# Patient Record
Sex: Male | Born: 1950 | Race: White | Hispanic: No | Marital: Married | Smoking: Former smoker
Health system: Southern US, Community
[De-identification: ages and names within clinical notes are randomized; demographics above are authoritative.]

## PROBLEM LIST (undated history)

## (undated) DIAGNOSIS — Z87442 Personal history of urinary calculi: Secondary | ICD-10-CM

## (undated) DIAGNOSIS — I10 Essential (primary) hypertension: Secondary | ICD-10-CM

## (undated) DIAGNOSIS — N210 Calculus in bladder: Secondary | ICD-10-CM

## (undated) DIAGNOSIS — A419 Sepsis, unspecified organism: Secondary | ICD-10-CM

## (undated) DIAGNOSIS — M1990 Unspecified osteoarthritis, unspecified site: Secondary | ICD-10-CM

## (undated) DIAGNOSIS — E785 Hyperlipidemia, unspecified: Secondary | ICD-10-CM

## (undated) DIAGNOSIS — C4490 Unspecified malignant neoplasm of skin, unspecified: Secondary | ICD-10-CM

## (undated) DIAGNOSIS — M109 Gout, unspecified: Secondary | ICD-10-CM

## (undated) HISTORY — PX: DERMABRASION OF FACE: SUR411

---

## 2000-11-20 ENCOUNTER — Encounter: Payer: Self-pay | Admitting: Emergency Medicine

## 2000-11-20 ENCOUNTER — Emergency Department (HOSPITAL_COMMUNITY): Admission: EM | Admit: 2000-11-20 | Discharge: 2000-11-20 | Payer: Self-pay | Admitting: Emergency Medicine

## 2008-01-24 ENCOUNTER — Ambulatory Visit: Payer: Self-pay | Admitting: Urology

## 2011-08-18 ENCOUNTER — Other Ambulatory Visit: Payer: Self-pay | Admitting: Urology

## 2011-09-11 ENCOUNTER — Encounter (HOSPITAL_COMMUNITY): Payer: Self-pay | Admitting: Pharmacy Technician

## 2011-09-16 ENCOUNTER — Ambulatory Visit (HOSPITAL_COMMUNITY)
Admission: RE | Admit: 2011-09-16 | Discharge: 2011-09-16 | Disposition: A | Discharge status: Home / Self Care | Payer: 59 | Source: Ambulatory Visit | Attending: Urology | Admitting: Urology

## 2011-09-16 ENCOUNTER — Encounter (HOSPITAL_COMMUNITY)
Admission: RE | Admit: 2011-09-16 | Discharge: 2011-09-16 | Disposition: A | Discharge status: Home / Self Care | Payer: 59 | Source: Ambulatory Visit | Attending: Urology | Admitting: Urology

## 2011-09-16 ENCOUNTER — Encounter (HOSPITAL_COMMUNITY): Payer: Self-pay

## 2011-09-16 ENCOUNTER — Other Ambulatory Visit: Payer: Self-pay

## 2011-09-16 DIAGNOSIS — I10 Essential (primary) hypertension: Secondary | ICD-10-CM | POA: Insufficient documentation

## 2011-09-16 DIAGNOSIS — Z01818 Encounter for other preprocedural examination: Secondary | ICD-10-CM | POA: Insufficient documentation

## 2011-09-16 DIAGNOSIS — N210 Calculus in bladder: Secondary | ICD-10-CM | POA: Insufficient documentation

## 2011-09-16 DIAGNOSIS — Z01812 Encounter for preprocedural laboratory examination: Secondary | ICD-10-CM | POA: Insufficient documentation

## 2011-09-16 HISTORY — DX: Unspecified malignant neoplasm of skin, unspecified: C44.90

## 2011-09-16 HISTORY — DX: Essential (primary) hypertension: I10

## 2011-09-16 HISTORY — DX: Calculus in bladder: N21.0

## 2011-09-16 HISTORY — DX: Hyperlipidemia, unspecified: E78.5

## 2011-09-16 LAB — BASIC METABOLIC PANEL
BUN: 13 mg/dL (ref 6–23)
CO2: 26 mEq/L (ref 19–32)
Calcium: 9.4 mg/dL (ref 8.4–10.5)
Chloride: 103 mEq/L (ref 96–112)
Creatinine, Ser: 0.97 mg/dL (ref 0.50–1.35)
GFR calc Af Amer: >90 mL/min (ref >90)
GFR calc non Af Amer: 87 mL/min — ABNORMAL LOW (ref >90)
Glucose, Bld: 129 mg/dL — ABNORMAL HIGH (ref 70–99)
Potassium: 3.3 mEq/L — ABNORMAL LOW (ref 3.5–5.1)
Sodium: 140 mEq/L (ref 135–145)

## 2011-09-16 LAB — CBC
HCT: 45.2 % (ref 39.0–52.0)
Hemoglobin: 15.8 g/dL (ref 13.0–17.0)
MCH: 32.2 pg (ref 26.0–34.0)
MCHC: 35 g/dL (ref 30.0–36.0)
MCV: 92.1 fL (ref 78.0–100.0)
Platelets: 194 10*3/uL (ref 150–400)
RBC: 4.91 MIL/uL (ref 4.22–5.81)
RDW: 12.7 % (ref 11.5–15.5)
WBC: 8.3 10*3/uL (ref 4.0–10.5)

## 2011-09-16 LAB — SURGICAL PCR SCREEN: Staphylococcus aureus: NEGATIVE

## 2011-09-16 NOTE — Pre-Procedure Instructions (Signed)
Your patient has screened at an elevated risk for obstructive sleep apnea using the STOP-BANG tool during a presurgical visit. A score of 4 or greater is an elevated risk.  

## 2011-09-16 NOTE — Patient Instructions (Signed)
20 Gregg Callahan  09/16/2011   Your procedure is scheduled on:  09/24/11  Report to SHORT STAY DEPT  at 6:00 AM.  Call this number if you have problems the morning of surgery: [phone]   Remember:   Do not eat food or drink liquids AFTER MIDNIGHT    Take these medicines the morning of surgery with A SIP OF WATER: NONE   Do not wear jewelry, make-up or nail polish.  Do not wear lotions, powders, or perfumes.   Do not shave legs or underarms 48 hrs. before surgery (men may shave face)  Do not bring valuables to the hospital.  Contacts, dentures or bridgework may not be worn into surgery.  Leave suitcase in the car. After surgery it may be brought to your room.  For patients admitted to the hospital, checkout time is 11:00 AM the day of discharge.   Patients discharged the day of surgery will not be allowed to drive home.    Special Instructions:   Please read over the following fact sheets that you were given: MRSA  Information               SHOWER WITH BETASEPT THE NIGHT BEFORE SURGERY AND THE MORNING OF SURGERY

## 2011-09-16 NOTE — Progress Notes (Signed)
09/16/11 1611  OBSTRUCTIVE SLEEP APNEA  Have you ever been diagnosed with sleep apnea through a sleep study? No  Do you snore loudly (loud enough to be heard through closed doors)?  0  Do you often feel tired, fatigued, or sleepy during the daytime? 0  Has anyone observed you stop breathing during your sleep? 0  Do you have, or are you being treated for high blood pressure? 1  BMI more than 35 kg/m2? 0  Age over 62 years old? 1  Neck circumference greater than 40 cm/18 inches? 1  Gender: 1  Obstructive Sleep Apnea Score 4   Score 4 or greater  Updated health history;Results sent to PCP

## 2011-09-24 ENCOUNTER — Encounter (HOSPITAL_COMMUNITY): Payer: Self-pay | Admitting: Anesthesiology

## 2011-09-24 ENCOUNTER — Encounter (HOSPITAL_COMMUNITY): Payer: Self-pay | Admitting: *Deleted

## 2011-09-24 ENCOUNTER — Ambulatory Visit (HOSPITAL_COMMUNITY)
Admission: RE | Admit: 2011-09-24 | Discharge: 2011-09-25 | Disposition: A | Discharge status: Home / Self Care | Payer: 59 | Source: Ambulatory Visit | Attending: Urology | Admitting: Urology

## 2011-09-24 ENCOUNTER — Ambulatory Visit (HOSPITAL_COMMUNITY): Payer: 59 | Admitting: Anesthesiology

## 2011-09-24 ENCOUNTER — Encounter (HOSPITAL_COMMUNITY)
Admission: RE | Disposition: A | Discharge status: Home / Self Care | Payer: Self-pay | Source: Ambulatory Visit | Attending: Urology

## 2011-09-24 DIAGNOSIS — I10 Essential (primary) hypertension: Secondary | ICD-10-CM | POA: Insufficient documentation

## 2011-09-24 DIAGNOSIS — E785 Hyperlipidemia, unspecified: Secondary | ICD-10-CM | POA: Insufficient documentation

## 2011-09-24 DIAGNOSIS — Z7982 Long term (current) use of aspirin: Secondary | ICD-10-CM | POA: Insufficient documentation

## 2011-09-24 DIAGNOSIS — N210 Calculus in bladder: Secondary | ICD-10-CM | POA: Insufficient documentation

## 2011-09-24 DIAGNOSIS — N400 Enlarged prostate without lower urinary tract symptoms: Secondary | ICD-10-CM

## 2011-09-24 DIAGNOSIS — Z79899 Other long term (current) drug therapy: Secondary | ICD-10-CM | POA: Insufficient documentation

## 2011-09-24 DIAGNOSIS — N401 Enlarged prostate with lower urinary tract symptoms: Secondary | ICD-10-CM | POA: Insufficient documentation

## 2011-09-24 DIAGNOSIS — G4730 Sleep apnea, unspecified: Secondary | ICD-10-CM | POA: Insufficient documentation

## 2011-09-24 DIAGNOSIS — N138 Other obstructive and reflux uropathy: Secondary | ICD-10-CM | POA: Insufficient documentation

## 2011-09-24 HISTORY — PX: TRANSURETHRAL RESECTION OF PROSTATE: SHX73

## 2011-09-24 HISTORY — PX: CYSTOSCOPY: SHX5120

## 2011-09-24 LAB — BASIC METABOLIC PANEL
BUN: 14 mg/dL (ref 6–23)
Calcium: 8.4 mg/dL (ref 8.4–10.5)
GFR calc non Af Amer: 77 mL/min — ABNORMAL LOW (ref >90)
Glucose, Bld: 142 mg/dL — ABNORMAL HIGH (ref 70–99)

## 2011-09-24 SURGERY — CYSTOSCOPY
Anesthesia: General | Wound class: Clean Contaminated

## 2011-09-24 MED ORDER — CEFAZOLIN SODIUM-DEXTROSE 2-3 GM-% IV SOLR
INTRAVENOUS | Status: AC
  Filled 2011-09-24: qty 50

## 2011-09-24 MED ORDER — MORPHINE SULFATE 2 MG/ML IJ SOLN: 2.0000 mg | INTRAMUSCULAR | Status: DC | PRN

## 2011-09-24 MED ORDER — SUCCINYLCHOLINE CHLORIDE 20 MG/ML IJ SOLN
INTRAMUSCULAR | Status: DC | PRN
  Administered 2011-09-24: 100 mg via INTRAVENOUS

## 2011-09-24 MED ORDER — LACTATED RINGERS IV SOLN
INTRAVENOUS | Status: DC | PRN
  Administered 2011-09-24 (×2): via INTRAVENOUS

## 2011-09-24 MED ORDER — BELLADONNA ALKALOIDS-OPIUM 16.2-60 MG RE SUPP
RECTAL | Status: AC
  Filled 2011-09-24: qty 1

## 2011-09-24 MED ORDER — HYDROCHLOROTHIAZIDE 25 MG PO TABS
25.0000 mg | ORAL_TABLET | Freq: Every day | ORAL | Status: DC
  Administered 2011-09-24: 25 mg via ORAL
  Filled 2011-09-24 (×3): qty 1

## 2011-09-24 MED ORDER — HYDROMORPHONE HCL PF 1 MG/ML IJ SOLN: 0.2500 mg | INTRAMUSCULAR | Status: DC | PRN

## 2011-09-24 MED ORDER — LIDOCAINE HCL (CARDIAC) 20 MG/ML IV SOLN
INTRAVENOUS | Status: DC | PRN
  Administered 2011-09-24: 100 mg via INTRAVENOUS

## 2011-09-24 MED ORDER — BELLADONNA ALKALOIDS-OPIUM 16.2-60 MG RE SUPP: 1.0000 | Freq: Four times a day (QID) | RECTAL | Status: DC | PRN

## 2011-09-24 MED ORDER — PROPOFOL 10 MG/ML IV EMUL
INTRAVENOUS | Status: DC | PRN
  Administered 2011-09-24: 200 mg via INTRAVENOUS

## 2011-09-24 MED ORDER — DEXAMETHASONE SODIUM PHOSPHATE 4 MG/ML IJ SOLN
INTRAMUSCULAR | Status: DC | PRN
  Administered 2011-09-24: 10 mg via INTRAVENOUS

## 2011-09-24 MED ORDER — 0.9 % SODIUM CHLORIDE (POUR BTL) OPTIME
TOPICAL | Status: DC | PRN
  Administered 2011-09-24: 1000 mL

## 2011-09-24 MED ORDER — ONDANSETRON HCL 4 MG/2ML IJ SOLN
INTRAMUSCULAR | Status: DC | PRN
  Administered 2011-09-24: 4 mg via INTRAVENOUS

## 2011-09-24 MED ORDER — ACETAMINOPHEN 10 MG/ML IV SOLN
INTRAVENOUS | Status: DC | PRN
  Administered 2011-09-24: 1000 mg via INTRAVENOUS

## 2011-09-24 MED ORDER — LIDOCAINE HCL 2 % EX GEL
CUTANEOUS | Status: AC
  Filled 2011-09-24: qty 10

## 2011-09-24 MED ORDER — CEFAZOLIN SODIUM-DEXTROSE 2-3 GM-% IV SOLR
2.0000 g | Freq: Three times a day (TID) | INTRAVENOUS | Status: AC
  Administered 2011-09-24 – 2011-09-25 (×2): 2 g via INTRAVENOUS
  Filled 2011-09-24 (×2): qty 50

## 2011-09-24 MED ORDER — MIDAZOLAM HCL 5 MG/5ML IJ SOLN
INTRAMUSCULAR | Status: DC | PRN
  Administered 2011-09-24: 2 mg via INTRAVENOUS

## 2011-09-24 MED ORDER — OXYCODONE HCL 5 MG PO TABS
5.0000 mg | ORAL_TABLET | ORAL | Status: DC | PRN
  Administered 2011-09-24 – 2011-09-25 (×4): 5 mg via ORAL
  Filled 2011-09-24: qty 2
  Filled 2011-09-24 (×3): qty 1

## 2011-09-24 MED ORDER — STERILE WATER FOR IRRIGATION IR SOLN
Status: DC | PRN
  Administered 2011-09-24: 500 mL

## 2011-09-24 MED ORDER — FENTANYL CITRATE 0.05 MG/ML IJ SOLN
INTRAMUSCULAR | Status: DC | PRN
  Administered 2011-09-24 (×3): 50 ug via INTRAVENOUS
  Administered 2011-09-24: 100 ug via INTRAVENOUS

## 2011-09-24 MED ORDER — SODIUM CHLORIDE 0.9 % IV SOLN
INTRAVENOUS | Status: DC
  Administered 2011-09-24 – 2011-09-25 (×2): via INTRAVENOUS

## 2011-09-24 MED ORDER — SODIUM CHLORIDE 0.9 % IR SOLN
Status: DC | PRN
  Administered 2011-09-24: 5400 mL

## 2011-09-24 MED ORDER — ACETAMINOPHEN 10 MG/ML IV SOLN
INTRAVENOUS | Status: AC
  Filled 2011-09-24: qty 100

## 2011-09-24 MED ORDER — CEFAZOLIN SODIUM 1-5 GM-% IV SOLN
1.0000 g | INTRAVENOUS | Status: AC
  Administered 2011-09-24: 2 g via INTRAVENOUS

## 2011-09-24 MED ORDER — BACITRACIN-NEOMYCIN-POLYMYXIN 400-5-5000 EX OINT
1.0000 "application " | TOPICAL_OINTMENT | Freq: Three times a day (TID) | CUTANEOUS | Status: DC | PRN
  Administered 2011-09-24: 1 via TOPICAL

## 2011-09-24 MED ORDER — HYDROCHLOROTHIAZIDE 25 MG PO TABS: 25.0000 mg | ORAL_TABLET | Freq: Every day | ORAL | Status: DC

## 2011-09-24 MED ORDER — SENNOSIDES-DOCUSATE SODIUM 8.6-50 MG PO TABS
1.0000 | ORAL_TABLET | Freq: Two times a day (BID) | ORAL | Status: DC
  Administered 2011-09-24 (×2): 1 via ORAL
  Filled 2011-09-24 (×4): qty 1

## 2011-09-24 MED ORDER — LACTATED RINGERS IV SOLN: INTRAVENOUS | Status: DC

## 2011-09-24 MED ORDER — LISINOPRIL 40 MG PO TABS: 40.0000 mg | ORAL_TABLET | Freq: Every morning | ORAL | Status: DC

## 2011-09-24 MED ORDER — HYDROMORPHONE HCL PF 1 MG/ML IJ SOLN
INTRAMUSCULAR | Status: DC | PRN
  Administered 2011-09-24 (×4): 0.5 mg via INTRAVENOUS

## 2011-09-24 MED ORDER — LISINOPRIL 40 MG PO TABS
40.0000 mg | ORAL_TABLET | Freq: Every morning | ORAL | Status: DC
  Administered 2011-09-24: 40 mg via ORAL
  Filled 2011-09-24 (×3): qty 1

## 2011-09-24 MED ORDER — ACETAMINOPHEN 10 MG/ML IV SOLN
1000.0000 mg | Freq: Four times a day (QID) | INTRAVENOUS | Status: DC
  Administered 2011-09-24 – 2011-09-25 (×3): 1000 mg via INTRAVENOUS
  Filled 2011-09-24 (×4): qty 100

## 2011-09-24 MED ORDER — ONDANSETRON HCL 4 MG/2ML IJ SOLN: 4.0000 mg | INTRAMUSCULAR | Status: DC | PRN

## 2011-09-24 MED ORDER — ACETAMINOPHEN 325 MG PO TABS: 650.0000 mg | ORAL_TABLET | ORAL | Status: DC | PRN

## 2011-09-24 MED ORDER — SIMVASTATIN 40 MG PO TABS
40.0000 mg | ORAL_TABLET | Freq: Every day | ORAL | Status: DC
  Administered 2011-09-24: 40 mg via ORAL
  Filled 2011-09-24 (×2): qty 1

## 2011-09-24 MED ORDER — BELLADONNA ALKALOIDS-OPIUM 16.2-60 MG RE SUPP
RECTAL | Status: DC | PRN
  Administered 2011-09-24: 1 via RECTAL

## 2011-09-24 MED ORDER — IOHEXOL 300 MG/ML  SOLN
INTRAMUSCULAR | Status: AC
  Filled 2011-09-24: qty 1

## 2011-09-24 SURGICAL SUPPLY — 36 items
BAG URINE DRAINAGE (UROLOGICAL SUPPLIES) IMPLANT
BAG URO CATCHER STRL LF (DRAPE) ×2 IMPLANT
BLADE SURG 15 STRL LF DISP TIS (BLADE) IMPLANT
BLADE SURG 15 STRL SS (BLADE)
CATCHER STONE W/TUBE ADAPTER (UROLOGICAL SUPPLIES) IMPLANT
CATH COUDE 24FR 5CC (CATHETERS) IMPLANT
CATH HEMA 3WAY 30CC 24FR COUDE (CATHETERS) ×1 IMPLANT
CATH RIBBED COUDE  30CC (CATHETERS)
CATH RIBBED COUDE 30CC (CATHETERS)
CLOTH BEACON ORANGE TIMEOUT ST (SAFETY) ×2 IMPLANT
DRAPE CAMERA CLOSED 9X96 (DRAPES) ×2 IMPLANT
ELECT LOOP MED HF 24F 12D (CUTTING LOOP) ×2 IMPLANT
ELECT REM PT RETURN 9FT ADLT (ELECTROSURGICAL) ×2
ELECT RESECT VAPORIZE 12D CBL (ELECTRODE) ×2 IMPLANT
ELECTRODE REM PT RTRN 9FT ADLT (ELECTROSURGICAL) ×1 IMPLANT
GLOVE BIOGEL M 7.0 STRL (GLOVE) ×4 IMPLANT
GLOVE BIOGEL PI IND STRL 7.5 (GLOVE) ×1 IMPLANT
GLOVE BIOGEL PI INDICATOR 7.5 (GLOVE) ×1
GLOVE ECLIPSE 7.5 STRL STRAW (GLOVE) ×2 IMPLANT
GOWN PREVENTION PLUS XLARGE (GOWN DISPOSABLE) ×2 IMPLANT
GOWN STRL NON-REIN LRG LVL3 (GOWN DISPOSABLE) ×2 IMPLANT
GUIDEWIRE STR DUAL SENSOR (WIRE) ×2 IMPLANT
HOLDER FOLEY CATH W/STRAP (MISCELLANEOUS) IMPLANT
KIT ASPIRATION TUBING (SET/KITS/TRAYS/PACK) ×2 IMPLANT
KIT SUPRAPUBIC CATH (MISCELLANEOUS) IMPLANT
MANIFOLD NEPTUNE II (INSTRUMENTS) ×2 IMPLANT
NDL SPNL 18GX3.5 QUINCKE PK (NEEDLE) IMPLANT
NEEDLE SPNL 18GX3.5 QUINCKE PK (NEEDLE) IMPLANT
NS IRRIG 1000ML POUR BTL (IV SOLUTION) ×2 IMPLANT
PACK CYSTO (CUSTOM PROCEDURE TRAY) ×2 IMPLANT
PROBE LITHOCLAST ULTRA 3.8X403 (UROLOGICAL SUPPLIES) ×1 IMPLANT
STONE CATCHER W/TUBE ADAPTER (UROLOGICAL SUPPLIES) ×2 IMPLANT
SUT ETHILON 3 0 PS 1 (SUTURE) IMPLANT
SYR 30ML LL (SYRINGE) IMPLANT
SYRINGE IRR TOOMEY STRL 70CC (SYRINGE) ×2 IMPLANT
TUBING CONNECTING 10 (TUBING) ×2 IMPLANT

## 2011-09-24 NOTE — Anesthesia Postprocedure Evaluation (Signed)
  Anesthesia Post-op Note  Patient: Gregg Callahan  Procedure(s) Performed: Procedure(s) (LRB): CYSTOSCOPY (N/A) TRANSURETHRAL RESECTION OF THE PROSTATE WITH GYRUS INSTRUMENTS (N/A)  Patient Location: PACU  Anesthesia Type: General  Level of Consciousness: oriented and sedated  Airway and Oxygen Therapy: Patient Spontanous Breathing  Post-op Pain: mild  Post-op Assessment: Post-op Vital signs reviewed, Patient's Cardiovascular Status Stable, Respiratory Function Stable and Patent Airway  Post-op Vital Signs: stable  Complications: No apparent anesthesia complications

## 2011-09-24 NOTE — Brief Op Note (Signed)
09/24/2011  11:20 AM  PATIENT:  Gregg Callahan  61 y.o. male  PRE-OPERATIVE DIAGNOSIS:  BLADDER STONE, BPH  POST-OPERATIVE DIAGNOSIS:  Same  PROCEDURE:  Procedure(s) (LRB): CYSTOSCOPY (N/A) Urethral meatal dilation Cystolithalopaxy TRANSURETHRAL RESECTION OF THE PROSTATE WITH GYRUS INSTRUMENTS (N/A)  SURGEON:  Surgeon(s) and Role:    * Milford Cage, MD - Primary  PHYSICIAN ASSISTANT:   ASSISTANTS: none   ANESTHESIA:   general  EBL: 150cc  Total I/O In: 1000 [I.V.:1000] Out: -   BLOOD ADMINISTERED:none  DRAINS: Urinary Catheter (Foley)   LOCAL MEDICATIONS USED:  OTHER B&O suppository.   SPECIMEN:  Source of Specimen:  prostate, bladder stone.  DISPOSITION OF SPECIMEN:  stone given to family.  COUNTS:  YES  TOURNIQUET:  * No tourniquets in log *  DICTATION: .Other Dictation: Dictation Number [id number]  PLAN OF CARE: Admit for overnight observation  PATIENT DISPOSITION:  PACU - hemodynamically stable.   Delay start of Pharmacological VTE agent (>24hrs) due to surgical blood loss or risk of bleeding: yes

## 2011-09-24 NOTE — Anesthesia Preprocedure Evaluation (Signed)
Anesthesia Evaluation  Patient identified by MRN, date of birth, ID band Patient awake    Reviewed: Allergy & Precautions, H&P , NPO status , Patient's Chart, lab work & pertinent test results, reviewed documented beta blocker date and time   Airway Mallampati: II  Neck ROM: Full    Dental  (+) Dental Advisory Given and Teeth Intact   Pulmonary neg pulmonary ROS,  breath sounds clear to auscultation        Cardiovascular hypertension, Pt. on medications Rhythm:Regular Rate:Normal  Denies cardiac symptoms   Neuro/Psych negative neurological ROS  negative psych ROS   GI/Hepatic negative GI ROS, Neg liver ROS,   Endo/Other  negative endocrine ROS  Renal/GU negative Renal ROS   Bladder stone BPH    Musculoskeletal negative musculoskeletal ROS (+)   Abdominal   Peds negative pediatric ROS (+)  Hematology negative hematology ROS (+)   Anesthesia Other Findings Caps everywhere  Reproductive/Obstetrics negative OB ROS                           Anesthesia Physical Anesthesia Plan  ASA: II  Anesthesia Plan: General   Post-op Pain Management:    Induction: Intravenous  Airway Management Planned: Oral ETT  Additional Equipment:   Intra-op Plan:   Post-operative Plan: Extubation in OR  Informed Consent: I have reviewed the patients History and Physical, chart, labs and discussed the procedure including the risks, benefits and alternatives for the proposed anesthesia with the patient or authorized representative who has indicated his/her understanding and acceptance.   Dental advisory given  Plan Discussed with: CRNA and Surgeon  Anesthesia Plan Comments:         Anesthesia Quick Evaluation

## 2011-09-24 NOTE — Discharge Instructions (Signed)
DISCHARGE INSTRUCTIONS FOR TURP ° °MEDICATIONS: ° °1. DO NOT RESUME YOUR ASPIRIN, WARFARIN, OR OTHER BLOOD THINNER FOR 1 WEEK. ° °2. Resume all your other meds from home, including finasteride and tamsulosin if you were taking them before. ° °ACTIVITY °1. No heavy lifting >10 pounds for 2 weeks °2. No sexual activity for 2 weeks °3. No strenuous activity for 2 weeks °4. No driving while on narcotic pain medications °5. Drink plenty of water °6. Continue to walk at home - you can still get blood clots when you are at  °home, so keep active, but don't over do it. °7. Your urine may have some blood in it - make sure you drink plenty of water,  °call or come to the ER immediately if you can't urinate at all °8. No straddle activity (such as riding a bike or a horse)for 3 weeks. ° °BATHING °1. You can shower. Do not take baths or submerge the catheter underwater. ° °CATHETER (If you are discharged with a catheter.) °1. Keep your catheter secured to your leg at all times with tape. °2. You may experience leakage of urine around your catheter- as long as the  °catheter continues to drain, this is normal.  If your catheter stops draining  °return to the ER, but do not let anyone other than a urologist replace your  °catheter. °3. You may also have blood in your urine, even after it has been clear for  °several days; you may even pass some small blood clots or other material.  This  °is normal as well.  If this happens, sit down and drink plenty of water to help  °make urine to flush out your bladder.  If the blood in your urine becomes worse  °after doing this, contact our office or return to the ER. °4. You may use the leg bag (small bag) during the day, but use the large bag at  °night. ° ° ° °SIGNS/SYMPTOMS TO CALL: °1. Please call us if you have a fever greater than 101.5, uncontrolled  °nausea/vomiting, uncontrolled pain, dizziness, unable to urinate,  °chest pain, shortness of breath, leg swelling, leg pain, or any  other concerns  °or questions. ° °You can reach us at [phone]. ° °  °

## 2011-09-24 NOTE — Transfer of Care (Signed)
Immediate Anesthesia Transfer of Care Note  Patient: Gregg Callahan  Procedure(s) Performed: Procedure(s) (LRB): CYSTOSCOPY (N/A) TRANSURETHRAL RESECTION OF THE PROSTATE WITH GYRUS INSTRUMENTS (N/A)  Patient Location: PACU  Anesthesia Type: General  Level of Consciousness: awake, sedated and patient cooperative  Airway & Oxygen Therapy: Patient Spontanous Breathing and Patient connected to face mask oxygen  Post-op Assessment: Report given to PACU RN and Post -op Vital signs reviewed and stable  Post vital signs: Reviewed and stable  Complications: No apparent anesthesia complications

## 2011-09-24 NOTE — H&P (Signed)
Urology History and Physical Exam  CC: Bladder stone, BPH  HPI:       61 year old male with dysuria, frequency, and urgency. He has a history of BPH. He was found to have a bladder calculus.  Due to his enlarged prostate causing urinary symptoms and his bladder stone he has chosen to have transurethral resection of the prostate and cystolithalopaxy.  We have discussed the risks, benefits, alternatives, and likelihood of achieving his goals.  UA from clinic 08/15/11 was negative for signs of infection.   PMH: Past Medical History  Diagnosis Date  . Hypertension   . Hyperlipemia   . Bladder stone   . Skin cancer   . Sleep apnea     STOP BANG SCORE 4    PSH: Past Surgical History  Procedure Date  . Dermabrasion of face     Allergies: Allergies  Allergen Reactions  . Benadryl (Diphenhydramine Hcl) Swelling    Medications: Prescriptions prior to admission  Medication Sig Dispense Refill  . hydrochlorothiazide (HYDRODIURIL) 25 MG tablet Take 25 mg by mouth daily with breakfast.      . lisinopril (PRINIVIL,ZESTRIL) 40 MG tablet Take 40 mg by mouth daily with breakfast.      . pravastatin (PRAVACHOL) 80 MG tablet Take 80 mg by mouth every evening.      Marland Kitchen aspirin 81 MG chewable tablet Chew 81 mg by mouth daily with breakfast.         Social History: History   Social History  . Marital Status: Married    Spouse Name: N/A    Number of Children: N/A  . Years of Education: N/A   Occupational History  . Not on file.   Social History Main Topics  . Smoking status: Former Smoker    Quit date: 09/15/2001  . Smokeless tobacco: Not on file  . Alcohol Use: Yes     4 oz daily  . Drug Use:   . Sexually Active:    Other Topics Concern  . Not on file   Social History Narrative  . No narrative on file    Family History: History reviewed. No pertinent family history.  Review of Systems: Positive: None Negative: Hematuria, fever, chest pain.  A further 10 point review of  systems was negative except what is listed in the HPI.  Physical Exam:  General: No acute distress.  Awake. Head:  Normocephalic.  Atraumatic. ENT:  EOMI.  Mucous membranes moist Neck:  Supple.  No lymphadenopathy. CV:  S1 present. S2 present. Regular rate. Pulmonary: Equal effort bilaterally.  Clear to auscultation bilaterally. Abdomen: Soft.  Non- tender to palpation. Skin:  Normal turgor.  No visible rash. Extremity: No gross deformity of bilateral upper extremities.  No gross deformity of    bilateral lower extremities. Neurologic: Alert. Appropriate mood.  Studies:  No results found for this basename: HGB:2,WBC:2,PLT:2 in the last 72 hours  No results found for this basename: NA:2,K:2,CL:2,CO2:2,BUN:2,CREATININE:2,CALCIUM:2,MAGNESIUM:2,GFRNONAA:2,GFRAA:2 in the last 72 hours   No results found for this basename: PT:2,INR:2,APTT:2 in the last 72 hours   No components found with this basename: ABG:2    Assessment:  Bladder stone and BPH.  Plan: To OR for TURP and cystolithalopaxy.

## 2011-09-24 NOTE — Progress Notes (Signed)
GU post-op note  Patient doing well. Has some stinging/burning at the tip of penis. No abdominal pain. I explained the surgery and findings.  Filed Vitals:   09/24/11 1404  BP: 150/86  Pulse: 67  Temp: 97.8 F (36.6 C)  Resp: 18   Gen: NAD Abd: soft, NTTP CV: RRR Chest: equal effort bilaterally  A/P: Cystolithalopaxy & TURP today. -Continue CBI -Plan for likely discharge home in the morning.

## 2011-09-24 NOTE — Preoperative (Signed)
Beta Blockers   Reason not to administer Beta Blockers:Not Applicable 

## 2011-09-25 MED ORDER — BACITRACIN-NEOMYCIN-POLYMYXIN 400-5-5000 EX OINT: 1.0000 "application " | TOPICAL_OINTMENT | Freq: Three times a day (TID) | CUTANEOUS | Status: AC | PRN

## 2011-09-25 MED ORDER — HYOSCYAMINE SULFATE 0.125 MG PO TABS: 0.1250 mg | ORAL_TABLET | ORAL | Status: DC | PRN

## 2011-09-25 MED ORDER — OXYCODONE HCL 5 MG PO TABS: 5.0000 mg | ORAL_TABLET | ORAL | Status: DC | PRN

## 2011-09-25 MED ORDER — SENNOSIDES-DOCUSATE SODIUM 8.6-50 MG PO TABS: 1.0000 | ORAL_TABLET | Freq: Two times a day (BID) | ORAL | Status: DC

## 2011-09-25 MED ORDER — BACITRACIN-NEOMYCIN-POLYMYXIN 400-5-5000 EX OINT: 1.0000 "application " | TOPICAL_OINTMENT | Freq: Three times a day (TID) | CUTANEOUS | Status: DC | PRN

## 2011-09-25 MED ORDER — SENNOSIDES-DOCUSATE SODIUM 8.6-50 MG PO TABS: 1.0000 | ORAL_TABLET | Freq: Two times a day (BID) | ORAL | Status: AC

## 2011-09-25 MED ORDER — OXYCODONE HCL 5 MG PO TABS: 5.0000 mg | ORAL_TABLET | ORAL | Status: AC | PRN

## 2011-09-25 NOTE — Discharge Summary (Signed)
Physician Discharge Summary  Patient ID: Gregg Callahan MRN: [id number] DOB/AGE: 62/06/1950 62 y.o.  Admit date: 09/24/2011 Discharge date: 09/25/2011  Admission Diagnoses:     BPH, bladder stone  Discharge Diagnoses:  BPH Bladder stone  Discharged Condition: good  Hospital Course:  Admitted after TURP and cystolithalopaxy for continuous bladder irrigation (CBI). This was run overnight and able to be turned off early on POD#1. He was able to control his pain with oral medication and tolerated a regular diet. He was discharged home with the catheter in place.  Consults: None  Significant Diagnostic Studies: None  Treatments: surgery: Transurethral resection of prostate, cystolithalopaxy.  Discharge Exam: Blood pressure 122/66, pulse 58, temperature 97.6 F (36.4 C), temperature source Oral, resp. rate 18, height 6' (1.829 m), weight 109.907 kg (242 lb 4.8 oz), SpO2 97.00%. See PE from progress note on day of discharge.  Disposition: Home- self care.  Discharge Orders    Future Orders Please Complete By Expires   Discharge patient        Medication List  As of 09/25/2011  7:19 AM   STOP taking these medications         aspirin 81 MG chewable tablet         TAKE these medications         hydrochlorothiazide 25 MG tablet   Commonly known as: HYDRODIURIL   Take 25 mg by mouth daily with breakfast.      hyoscyamine 0.125 MG tablet   Commonly known as: LEVSIN, ANASPAZ   Take 1 tablet (0.125 mg total) by mouth every 4 (four) hours as needed for cramping (bladder spasms).      lisinopril 40 MG tablet   Commonly known as: PRINIVIL,ZESTRIL   Take 40 mg by mouth daily with breakfast.      neomycin-bacitracin-polymyxin ointment   Commonly known as: NEOSPORIN   Apply 1 application topically 3 (three) times daily as needed (catheter irritation). apply to eye      oxyCODONE 5 MG immediate release tablet   Commonly known as: Oxy IR/ROXICODONE   Take 1-2 tablets (5-10 mg  total) by mouth every 4 (four) hours as needed.      pravastatin 80 MG tablet   Commonly known as: PRAVACHOL   Take 80 mg by mouth every evening.      senna-docusate 8.6-50 MG per tablet   Commonly known as: Senokot-S   Take 1 tablet by mouth 2 (two) times daily.           Follow-up Information    Follow up with Saunders Revel, PA on 09/29/2011. (10:00 am)    Contact information:   [address] [facility] [address] [facility] [place] [address] [phone]          Signed: Milford Cage 09/25/2011, 7:19 AM

## 2011-09-25 NOTE — Progress Notes (Signed)
Urology Progress Note  Subjective:     No acute urologic events overnight. Pain controlled. Urine remains light to dark pink off CBI for 2 hours.  ROS: Negative: chest pain  Objective:  Patient Vitals for the past 24 hrs:  BP Temp Temp src Pulse Resp SpO2 Height Weight  09/25/11 0608 122/66 mmHg 97.6 F (36.4 C) Oral 58  18  97 % - -  09/25/11 0154 122/62 mmHg 97.7 F (36.5 C) Oral 63  18  97 % - -  09/24/11 2150 113/67 mmHg 98.3 F (36.8 C) Oral 81  18  96 % - -  09/24/11 1404 150/86 mmHg 97.8 F (36.6 C) Oral 67  18  96 % - -  09/24/11 1242 - - Oral - - - 6' (1.829 m) 109.907 kg (242 lb 4.8 oz)  09/24/11 1233 164/99 mmHg 97.5 F (36.4 C) - 73  18  95 % - -  09/24/11 1200 139/76 mmHg 97.1 F (36.2 C) - 75  14  100 % - -  09/24/11 1145 130/64 mmHg - - 80  19  100 % - -  09/24/11 1130 129/74 mmHg 97.6 F (36.4 C) - 76  14  98 % - -    Physical Exam: General:  No acute distress, awake Cardiovascular:    [x]   S1/S2 present, RRR  []   Irregularly irregular Chest:  CTA-B Abdomen:               []  Soft, appropriately TTP  [x]  Soft, NTTP  []  Soft, appropriately TTP, incision(s) clean/dry/intact  Genitourinary: Catheter in place. No scrotal edema. Foley:  Draining light pink to dark pink fluid without CBI running.    I/O last 3 completed shifts: In: 16109 [P.O.:240; I.V.:3940; Other:7000; IV Piggyback:400] Out: 60454 [Urine:16475]  Recent Labs  [facility] 09/24/11 1154   HGB 14.8   WBC --   PLT --    Recent Labs  Basename 09/24/11 1154   NA 138   K 4.3   CL 105   CO2 26   BUN 14   CREATININE 1.02   CALCIUM 8.4   GFRNONAA 77*   GFRAA 89*     No results found for this basename: PT:2,INR:2,APTT:2 in the last 72 hours   No components found with this basename: ABG:2    Length of stay: 1 days.  Assessment: BPH/bladder stone. POD#1 TURP & cystolithalopaxy  Plan: -Patient may ambulate. -Discharge home.   Natalia Leatherwood, MD [phone]

## 2011-09-25 NOTE — Op Note (Signed)
NAME:  Gregg Callahan, Gregg Callahan NO.:  [id number]  MEDICAL RECORD NO.:  [id number]  LOCATION:  1435                         FACILITY:  [facility]  PHYSICIAN:  Natalia Leatherwood, MD    DATE OF BIRTH:  [birth year]  DATE OF PROCEDURE:  09/24/2011 DATE OF DISCHARGE:                                OPERATIVE REPORT   SURGEON:  Natalia Leatherwood, MD.  ASSISTANT:  None.  PREOPERATIVE DIAGNOSES:  Bladder stone and benign prostatic hypertrophy.  POSTOPERATIVE DIAGNOSES:  Bladder stone and benign prostatic hypertrophy.  PROCEDURES PERFORMED: 1. Transurethral resection of prostate. 2. Cystolitholapaxy. 3. Urethral meatal dilation. 4. Cystoscopy.  COMPLICATIONS:  None.  ESTIMATED BLOOD LOSS:  Approximately 150 cc.  DRAINS:  24-French 3-way Foley catheter with 30 cc of sterile water in the balloon.  FINDINGS:  Large obstructing prostate and bladder stone.  SPECIMEN:  Bladder chips were sent for permanent pathology.  Bladder stone was given to the family.  HISTORY OF PRESENT ILLNESS:  This is a 61 year old gentleman who presented with a bladder stone to the clinic.  After counseling regarding options of removal of the stone, I also explained to the patient that a bladder stone is a sign of urinary obstruction usually due to prostatic enlargement.  After discussion of the risks, benefits, complications, side effects, and alternatives, he elected to proceed with TURP as well.  He presents for that procedure today.  PROCEDURE IN DETAIL:  Informed was obtained.  The patient was taken to the operating room, was placed in supine position.  IV antibiotics were infused and general anesthesia was induced.  The patient was then placed in a dorsal lithotomy position making sure to pad all pertinent neurovascular pressure points appropriately.  SCDs were in place and turned on prior to induction.  His genitals were then prepped and draped in the usual sterile fashion.  Time-out was performed  in which the correct patient, surgical site, and procedure were identified and agreed upon by the team.  Before I was able to place the cystoscope through the urethra it was noted that the cystoscope could not fit and therefore a meatal urethral dilation was performed with Hegar dilators beginning with 16-French and dilating up to 28-French.  After this was done, the cystoscope was passed easily and the location of the ureteral orifices were identified and the stone was identified.  Next, the rigid nephroscope was advanced through the urethra with the visual obturator, and the stone was identified, and lithotripsy was carried out with ultrasonic power with the lithoclast.  After the stone fragments were broken up small enough to pass the sheath, the fragments were removed and set to the side.  These fragments were later given to the patient's family.  Next, the visual obturator to the gyrus resectoscope was passed through the urethra and into the bladder.  Both ureteral orifices were identified.  Next, dissection was carried out at the 6 o'clock position down to the bladder neck and carried distally to just proximal to the verumontanum.  After this was done, resection was carried out with the loop and the plasma button combination to resect down to create a channel in the prostate.  This was  carried around laterally on the left side first.  As the tissue was removed, it was noted that the anterior tissue and right lateral tissue did fall into the space where tissue had been removed during resection.  As tissue was removed, hemostasis was maintained.  All dissection was maintained proximal to the external urethral sphincter.  After this was done, resection was carried out on the right side in a similar fashion of the prostate. Some of the anterior prostatic tissue did have to be removed as it became obstructing after the lateral portions of the prostate were removed.  There was slight  undermining of the bladder neck noted and the circular fibers of the bladder were able to be seen.  After this was completed, all the chips were removed and these were sent to Pathology for permanent section.  Hemostasis was maintained.  All prostate chips were removed from the bladder. After this was completed, I pulled back to the verumontanum where there was a good open channel.  Next, a 24-French 3-way hematuria coude-tip Foley catheter was placed with ease and 30 cc of sterile water placed into the bladder. This was placed on mild traction and irrigated.  There were no chips left in the bladder and this was irrigated to a light pink.  Continuous bladder irrigation was set up, and turned on.  A belladonna and opioid suppository was then placed in the patient's rectum.  This completed the procedure.  He was placed back in supine position.  Anesthesia was reversed.  He was taken to PACU in stable condition.  He will be kept overnight for continuous bladder irrigation and observation.          ______________________________ Natalia Leatherwood, MD     DW/MEDQ  D:  09/24/2011  T:  09/25/2011  Job:  [id number]

## 2011-09-30 ENCOUNTER — Encounter (HOSPITAL_COMMUNITY): Payer: Self-pay | Admitting: Urology

## 2011-09-30 NOTE — OR Nursing (Signed)
Corrected amount of saline irrigation, Jannett Celestine, RN 09/30/2011

## 2012-05-18 ENCOUNTER — Other Ambulatory Visit: Payer: Self-pay | Admitting: Dermatology

## 2014-09-04 ENCOUNTER — Other Ambulatory Visit: Payer: Self-pay | Admitting: Dermatology

## 2015-03-02 ENCOUNTER — Ambulatory Visit: Payer: Self-pay | Admitting: Surgery

## 2015-03-02 NOTE — H&P (Signed)
Gregg Callahan 03/02/2015 9:17 AM Location: [facility] Patient #: [id number] DOB: 1951/05/04 Married / Language: Gregg Callahan / Race: White Male  History of Present Illness Gregg Moores A. Gregg Schmieder MD; 03/02/2015 1:01 PM) Patient words: Pt sent at the request of Dr Gregg Callahan for left inguinal hernia. Pt noticed a large left groin bulge for the last 5 months. Seen by urolgist for PSA follow up. Bulge not tender, slides in and out and located in the left groin. No nausea or vomiting.  The patient is a 64 year old male.   Other Problems Gregg Callahan, CMA; 03/02/2015 9:17 AM) High blood pressure Inguinal Hernia  Diagnostic Studies History Gregg Callahan, CMA; 03/02/2015 9:17 AM) Colonoscopy never  Allergies Gregg Callahan, CMA; 03/02/2015 9:18 AM) Benadryl *ANTIHISTAMINES* Swelling.  Medication History Gregg Callahan, CMA; 03/02/2015 9:18 AM) HydroCHLOROthiazide (25MG  Tablet, Oral) Active. Lisinopril (40MG  Tablet, Oral) Active. Pravastatin Sodium (80MG  Tablet, Oral) Active. Vitamin D (Ergocalciferol) (50000UNIT Capsule, Oral) Active. Medications Reconciled  Social History Gregg Callahan, [place]; 03/02/2015 9:17 AM) No caffeine use No drug use Tobacco use Former smoker.  Family History Gregg Callahan, [place]; 03/02/2015 9:17 AM) Alcohol Abuse Gregg Callahan. Cancer Gregg Callahan, Gregg Callahan. Prostate Cancer Gregg Callahan.     Review of Systems Gregg Callahan CMA; 03/02/2015 9:17 AM) General Not Present- Appetite Loss, Chills, Fatigue, Fever, Night Sweats, Weight Gain and Weight Loss. Skin Not Present- Change in Wart/Mole, Dryness, Hives, Jaundice, New Lesions, Non-Healing Wounds, Rash and Ulcer. HEENT Present- Wears glasses/contact lenses. Not Present- Earache, Hearing Loss, Hoarseness, Nose Bleed, Oral Ulcers, Ringing in the Ears, Seasonal Allergies, Sinus Pain, Sore Throat, Visual Disturbances and Yellow Eyes. Respiratory Not Present- Bloody sputum, Chronic Cough, Difficulty Breathing,  Snoring and Wheezing. Breast Not Present- Breast Mass, Breast Pain, Nipple Discharge and Skin Changes. Cardiovascular Not Present- Chest Pain, Difficulty Breathing Lying Down, Leg Cramps, Palpitations, Rapid Heart Rate, Shortness of Breath and Swelling of Extremities. Gastrointestinal Not Present- Abdominal Pain, Bloating, Bloody Stool, Change in Bowel Habits, Chronic diarrhea, Constipation, Difficulty Swallowing, Excessive gas, Gets full quickly at meals, Hemorrhoids, Indigestion, Nausea, Rectal Pain and Vomiting. Male Genitourinary Not Present- Blood in Urine, Change in Urinary Stream, Frequency, Impotence, Nocturia, Painful Urination, Urgency and Urine Leakage. Musculoskeletal Not Present- Back Pain, Joint Pain, Joint Stiffness, Muscle Pain, Muscle Weakness and Swelling of Extremities. Neurological Not Present- Decreased Memory, Fainting, Headaches, Numbness, Seizures, Tingling, Tremor, Trouble walking and Weakness. Psychiatric Not Present- Anxiety, Bipolar, Change in Sleep Pattern, Depression, Fearful and Frequent crying. Endocrine Not Present- Cold Intolerance, Excessive Hunger, Hair Changes, Heat Intolerance, Hot flashes and New Diabetes. Hematology Not Present- Easy Bruising, Excessive bleeding, Gland problems, HIV and Persistent Infections.  Vitals Gregg Callahan CMA; 03/02/2015 9:19 AM) 03/02/2015 9:18 AM Weight: 227.6 lb Height: 72in Body Surface Area: 2.25 m Body Mass Index: 30.87 kg/m  Temp.: 97.19F(Temporal)  Pulse: 65 (Regular)  BP: 130/70 (Sitting, Left Arm, Standard)      Physical Exam (Gregg Verrilli A. Dee Maday MD; 03/02/2015 1:02 PM)  General Mental Status-Alert. General Appearance-Consistent with stated age. Hydration-Well hydrated. Voice-Normal.  Head and Neck Head-normocephalic, atraumatic with no lesions or palpable masses. Trachea-midline. Thyroid Gland Characteristics - normal size and consistency.  Chest and Lung Exam Chest and lung exam  reveals -quiet, even and easy respiratory effort with no use of accessory muscles and on auscultation, normal breath sounds, no adventitious sounds and normal vocal resonance. Inspection Chest Wall - Normal. Back - normal.  Cardiovascular Cardiovascular examination reveals -normal heart sounds, regular rate and rhythm with no murmurs and normal pedal  pulses bilaterally.  Male Genitourinary Note: reducible LIH large with extension into the left hemiscrotum  Neurologic Neurologic evaluation reveals -alert and oriented x 3 with no impairment of recent or remote memory. Mental Status-Normal.  Musculoskeletal Normal Exam - Left-Upper Extremity Strength Normal and Lower Extremity Strength Normal. Normal Exam - Right-Upper Extremity Strength Normal and Lower Extremity Strength Normal.    Assessment & Plan (Gregg Berrian A. Jhonathan Desroches MD; 03/02/2015 1:02 PM)  LEFT INGUINAL HERNIA (K40.90) Impression: LARGE REDUCIBLE LEFT INGUINAL HERNIA PT DESIRES REPAIR DISCUSSED LAPAROSCOPIC , OPEN AND USE OF MESH PT WOULD LIKE TO PROCEED WITH OPEN REPAIR LIH with mesh The risk of hernia repair include bleeding, infection, organ injury, bowel injury, bladder injury, nerve injury recurrent hernia, blood clots, worsening of underlying condition, chronic pain, mesh use, open surgery, death, and the need for other operattions. Pt agrees to proceed  Current Plans You are being scheduled for surgery - Our schedulers will call you.  You should hear from our office's scheduling department within 5 working days about the location, date, and time of surgery. We try to make accommodations for patient's preferences in scheduling surgery, but sometimes the OR schedule or the surgeon's schedule prevents [place] from making those accommodations.  If you have not heard from our office [phone]) in 5 working days, call the office and ask for your surgeon's nurse.  If you have other questions about your diagnosis,  plan, or surgery, call the office and ask for your surgeon's nurse.  Written instructions provided Pt Education - Pamphlet Given - Hernia Surgery: discussed with patient and provided information. Pt Education - Pamphlet Given - Laparoscopic Hernia Repair: discussed with patient and provided information. The anatomy & physiology of the abdominal wall was discussed. The pathophysiology of hernias was discussed. Natural history risks without surgery including progeressive enlargement, pain, incarceration, & strangulation was discussed. Contributors to complications such as smoking, obesity, diabetes, prior surgery, etc were discussed.  I feel the risks of no intervention will lead to serious problems that outweigh the operative risks; therefore, I recommended surgery to reduce and repair the hernia. I explained laparoscopic techniques with possible need for an open approach. I noted the probable use of mesh to patch and/or buttress the hernia repair  Risks such as bleeding, infection, abscess, need for further treatment, heart attack, death, and other risks were discussed. I noted a good likelihood this will help address the problem. Goals of post-operative recovery were discussed as well. Possibility that this will not correct all symptoms was explained. I stressed the importance of low-impact activity, aggressive pain control, avoiding constipation, & not pushing through pain to minimize risk of post-operative chronic pain or injury. Possibility of reherniation especially with smoking, obesity, diabetes, immunosuppression, and other health conditions was discussed. We will work to minimize complications.  An educational handout further explaining the pathology & treatment options was given as well. Questions were answered. The patient expresses understanding & wishes to proceed with surgery.  Pt Education - CCS Hernia Post-Op HCI ([place]): discussed with patient and provided information. Pt Education -  Overview of treatment for inguinal and femoral hernia in adults: discussed with patient and provided information.

## 2018-07-27 ENCOUNTER — Encounter (HOSPITAL_COMMUNITY): Payer: Self-pay | Admitting: Internal Medicine

## 2018-07-27 ENCOUNTER — Other Ambulatory Visit: Payer: Self-pay

## 2018-07-27 ENCOUNTER — Emergency Department (HOSPITAL_COMMUNITY): Payer: Medicare Other

## 2018-07-27 ENCOUNTER — Inpatient Hospital Stay (HOSPITAL_COMMUNITY)
Admission: EM | Admit: 2018-07-27 | Discharge: 2018-07-29 | DRG: 871 | Disposition: A | Discharge status: Home / Self Care | Payer: Medicare Other | Attending: Internal Medicine | Admitting: Internal Medicine

## 2018-07-27 DIAGNOSIS — E7800 Pure hypercholesterolemia, unspecified: Secondary | ICD-10-CM

## 2018-07-27 DIAGNOSIS — Z85828 Personal history of other malignant neoplasm of skin: Secondary | ICD-10-CM

## 2018-07-27 DIAGNOSIS — C4490 Unspecified malignant neoplasm of skin, unspecified: Secondary | ICD-10-CM

## 2018-07-27 DIAGNOSIS — N179 Acute kidney failure, unspecified: Secondary | ICD-10-CM

## 2018-07-27 DIAGNOSIS — I10 Essential (primary) hypertension: Secondary | ICD-10-CM | POA: Diagnosis not present

## 2018-07-27 DIAGNOSIS — N210 Calculus in bladder: Secondary | ICD-10-CM | POA: Diagnosis present

## 2018-07-27 DIAGNOSIS — R7881 Bacteremia: Secondary | ICD-10-CM | POA: Diagnosis not present

## 2018-07-27 DIAGNOSIS — A021 Salmonella sepsis: Secondary | ICD-10-CM

## 2018-07-27 DIAGNOSIS — B961 Klebsiella pneumoniae [K. pneumoniae] as the cause of diseases classified elsewhere: Secondary | ICD-10-CM | POA: Diagnosis present

## 2018-07-27 DIAGNOSIS — Z87891 Personal history of nicotine dependence: Secondary | ICD-10-CM

## 2018-07-27 DIAGNOSIS — E876 Hypokalemia: Secondary | ICD-10-CM | POA: Diagnosis present

## 2018-07-27 DIAGNOSIS — Z87440 Personal history of urinary (tract) infections: Secondary | ICD-10-CM | POA: Diagnosis not present

## 2018-07-27 DIAGNOSIS — A419 Sepsis, unspecified organism: Secondary | ICD-10-CM | POA: Diagnosis not present

## 2018-07-27 DIAGNOSIS — Z79899 Other long term (current) drug therapy: Secondary | ICD-10-CM | POA: Diagnosis not present

## 2018-07-27 DIAGNOSIS — K4090 Unilateral inguinal hernia, without obstruction or gangrene, not specified as recurrent: Secondary | ICD-10-CM | POA: Diagnosis present

## 2018-07-27 DIAGNOSIS — N5089 Other specified disorders of the male genital organs: Secondary | ICD-10-CM | POA: Diagnosis present

## 2018-07-27 DIAGNOSIS — I21A1 Myocardial infarction type 2: Secondary | ICD-10-CM | POA: Diagnosis present

## 2018-07-27 DIAGNOSIS — R6520 Severe sepsis without septic shock: Secondary | ICD-10-CM

## 2018-07-27 DIAGNOSIS — E785 Hyperlipidemia, unspecified: Secondary | ICD-10-CM | POA: Diagnosis present

## 2018-07-27 DIAGNOSIS — A4159 Other Gram-negative sepsis: Principal | ICD-10-CM | POA: Diagnosis present

## 2018-07-27 DIAGNOSIS — Z87442 Personal history of urinary calculi: Secondary | ICD-10-CM | POA: Diagnosis not present

## 2018-07-27 HISTORY — DX: Sepsis, unspecified organism: A41.9

## 2018-07-27 HISTORY — DX: Hyperlipidemia, unspecified: E78.5

## 2018-07-27 HISTORY — DX: Essential (primary) hypertension: I10

## 2018-07-27 HISTORY — DX: Calculus in bladder: N21.0

## 2018-07-27 HISTORY — DX: Unspecified malignant neoplasm of skin, unspecified: C44.90

## 2018-07-27 LAB — CBC WITH DIFFERENTIAL/PLATELET
Abs Immature Granulocytes: 0.07 10*3/uL (ref 0.00–0.07)
Basophils Absolute: 0 10*3/uL (ref 0.0–0.1)
Basophils Relative: 0 %
Eosinophils Absolute: 0 10*3/uL (ref 0.0–0.5)
Eosinophils Relative: 0 %
HCT: 43.6 % (ref 39.0–52.0)
Hemoglobin: 14.4 g/dL (ref 13.0–17.0)
Immature Granulocytes: 1 %
Lymphocytes Relative: 2 %
Lymphs Abs: 0.3 10*3/uL — ABNORMAL LOW (ref 0.7–4.0)
MCH: 32.4 pg (ref 26.0–34.0)
MCHC: 33 g/dL (ref 30.0–36.0)
MCV: 98.2 fL (ref 80.0–100.0)
Monocytes Absolute: 0.9 10*3/uL (ref 0.1–1.0)
Monocytes Relative: 6 %
Neutro Abs: 13.5 10*3/uL — ABNORMAL HIGH (ref 1.7–7.7)
Neutrophils Relative %: 91 %
Platelets: 125 10*3/uL — ABNORMAL LOW (ref 150–400)
RBC: 4.44 MIL/uL (ref 4.22–5.81)
RDW: 12.8 % (ref 11.5–15.5)
WBC: 14.7 10*3/uL — AB (ref 4.0–10.5)
nRBC: 0 % (ref 0.0–0.2)

## 2018-07-27 LAB — COMPREHENSIVE METABOLIC PANEL
ALT: 22 U/L (ref 0–44)
AST: 27 U/L (ref 15–41)
Albumin: 3.2 g/dL — ABNORMAL LOW (ref 3.5–5.0)
Alkaline Phosphatase: 65 U/L (ref 38–126)
Anion gap: 12 (ref 5–15)
BUN: 26 mg/dL — ABNORMAL HIGH (ref 8–23)
CHLORIDE: 106 mmol/L (ref 98–111)
CO2: 23 mmol/L (ref 22–32)
Calcium: 8.8 mg/dL — ABNORMAL LOW (ref 8.9–10.3)
Creatinine, Ser: 1.33 mg/dL — ABNORMAL HIGH (ref 0.61–1.24)
GFR calc Af Amer: >60 mL/min (ref >60)
GFR, EST NON AFRICAN AMERICAN: 54 mL/min — AB (ref >60)
Glucose, Bld: 146 mg/dL — ABNORMAL HIGH (ref 70–99)
Potassium: 2.9 mmol/L — ABNORMAL LOW (ref 3.5–5.1)
Sodium: 141 mmol/L (ref 135–145)
Total Bilirubin: 1 mg/dL (ref 0.3–1.2)
Total Protein: 6.6 g/dL (ref 6.5–8.1)

## 2018-07-27 LAB — LACTIC ACID, PLASMA
LACTIC ACID, VENOUS: 1.3 mmol/L (ref 0.5–1.9)
Lactic Acid, Venous: 2.2 mmol/L (ref 0.5–1.9)
Lactic Acid, Venous: 1.2 mmol/L (ref 0.5–1.9)

## 2018-07-27 LAB — BASIC METABOLIC PANEL
Anion gap: 8 (ref 5–15)
BUN: 21 mg/dL (ref 8–23)
CALCIUM: 8 mg/dL — AB (ref 8.9–10.3)
CO2: 23 mmol/L (ref 22–32)
Chloride: 108 mmol/L (ref 98–111)
Creatinine, Ser: 0.95 mg/dL (ref 0.61–1.24)
GFR calc Af Amer: >60 mL/min (ref >60)
GFR calc non Af Amer: >60 mL/min (ref >60)
Glucose, Bld: 96 mg/dL (ref 70–99)
Potassium: 3.1 mmol/L — ABNORMAL LOW (ref 3.5–5.1)
Sodium: 139 mmol/L (ref 135–145)

## 2018-07-27 LAB — URINALYSIS, ROUTINE W REFLEX MICROSCOPIC
Bilirubin Urine: NEGATIVE
Glucose, UA: NEGATIVE mg/dL
KETONES UR: 5 mg/dL — AB
Nitrite: POSITIVE — AB
Protein, ur: 30 mg/dL — AB
Specific Gravity, Urine: 1.018 (ref 1.005–1.030)
WBC, UA: >50 WBC/hpf — ABNORMAL HIGH (ref 0–5)
pH: 5 (ref 5.0–8.0)

## 2018-07-27 LAB — MRSA PCR SCREENING: MRSA by PCR: NEGATIVE

## 2018-07-27 LAB — MAGNESIUM
Magnesium: 2 mg/dL (ref 1.7–2.4)
Magnesium: 1.9 mg/dL (ref 1.7–2.4)

## 2018-07-27 LAB — HIV ANTIBODY (ROUTINE TESTING W REFLEX): HIV Screen 4th Generation wRfx: NONREACTIVE

## 2018-07-27 LAB — TROPONIN I: Troponin I: 0.06 ng/mL (ref <0.03)

## 2018-07-27 MED ORDER — TRAMADOL HCL 50 MG PO TABS: 50.0000 mg | ORAL_TABLET | Freq: Four times a day (QID) | ORAL | Status: DC | PRN

## 2018-07-27 MED ORDER — PRAVASTATIN SODIUM 40 MG PO TABS
80.0000 mg | ORAL_TABLET | Freq: Every evening | ORAL | Status: DC
  Administered 2018-07-27 – 2018-07-28 (×2): 80 mg via ORAL
  Filled 2018-07-27: qty 4
  Filled 2018-07-27: qty 2

## 2018-07-27 MED ORDER — ENOXAPARIN SODIUM 40 MG/0.4ML ~~LOC~~ SOLN
40.0000 mg | Freq: Every day | SUBCUTANEOUS | Status: DC
  Administered 2018-07-27 – 2018-07-29 (×3): 40 mg via SUBCUTANEOUS
  Filled 2018-07-27 (×3): qty 0.4

## 2018-07-27 MED ORDER — SODIUM CHLORIDE 0.9 % IV SOLN
INTRAVENOUS | Status: DC
  Administered 2018-07-27 – 2018-07-28 (×4): via INTRAVENOUS

## 2018-07-27 MED ORDER — ACETAMINOPHEN 650 MG RE SUPP: 650.0000 mg | Freq: Four times a day (QID) | RECTAL | Status: DC | PRN

## 2018-07-27 MED ORDER — ACETAMINOPHEN 325 MG PO TABS
650.0000 mg | ORAL_TABLET | Freq: Once | ORAL | Status: AC
  Administered 2018-07-27: 650 mg via ORAL
  Filled 2018-07-27: qty 2

## 2018-07-27 MED ORDER — SODIUM CHLORIDE 0.9 % IV BOLUS
1000.0000 mL | Freq: Once | INTRAVENOUS | Status: AC
  Administered 2018-07-27: 1000 mL via INTRAVENOUS

## 2018-07-27 MED ORDER — SODIUM CHLORIDE 0.9 % IV SOLN
2.0000 g | INTRAVENOUS | Status: DC
  Administered 2018-07-28 – 2018-07-29 (×2): 2 g via INTRAVENOUS
  Filled 2018-07-27 (×2): qty 2

## 2018-07-27 MED ORDER — POTASSIUM CHLORIDE 10 MEQ/100ML IV SOLN
INTRAVENOUS | Status: AC
  Filled 2018-07-27: qty 100

## 2018-07-27 MED ORDER — ACETAMINOPHEN 325 MG PO TABS: 650.0000 mg | ORAL_TABLET | Freq: Four times a day (QID) | ORAL | Status: DC | PRN

## 2018-07-27 MED ORDER — ASPIRIN EC 81 MG PO TBEC
81.0000 mg | DELAYED_RELEASE_TABLET | Freq: Every day | ORAL | Status: DC
  Administered 2018-07-27 – 2018-07-29 (×3): 81 mg via ORAL
  Filled 2018-07-27 (×3): qty 1

## 2018-07-27 MED ORDER — SODIUM CHLORIDE 0.9 % IV SOLN
1.0000 g | INTRAVENOUS | Status: DC
  Administered 2018-07-27: 1 g via INTRAVENOUS
  Filled 2018-07-27: qty 10

## 2018-07-27 MED ORDER — POTASSIUM CHLORIDE 10 MEQ/100ML IV SOLN
10.0000 meq | INTRAVENOUS | Status: AC
  Administered 2018-07-27 (×5): 10 meq via INTRAVENOUS
  Filled 2018-07-27 (×4): qty 100

## 2018-07-27 NOTE — H&P (Signed)
[facility] History and Physical  Hershey Knauer OVF:[id number] DOB: 05-11-1951 DOA: 07/27/2018   PCP: Patria Mane, MD (Inactive)   Chief Complaint: 3-day history urinary frequency chills and urgency.  HPI: Gregg Callahan is a 68 y.o. WM PMHx bladder stone, HLD, HTN, skin cancer   presents from home 3-day history of urinary frequency, chills, urgency, generalized weakness. Has not checked his temperature at home but has had chills and feeling hot. No nausea or vomiting. No chest pain or shortness of breath. He denies any pain with urination or blood in urine. He is concerned that he has UTI because of his urgency and frequency and chills and weakness. He denies any abdominal pain, back pain, chest pain, shortness of breath. States he has no travel outside the country or been around anybody has been sick. EMS reports he was hypoxic but denied any cough or fever. Denies any shortness of breath or chest pain. Patient does have a history of kidney stones and skin cancer in the past.     Review of Systems:  Constitutional:  No weight loss, night sweats, Fevers, chills, fatigue.  HEENT:  No headaches, Difficulty swallowing,Tooth/dental problems,Sore throat,  No sneezing, itching, ear ache, nasal congestion, post nasal drip,  Cardio-vascular:  No chest pain, Orthopnea, PND, swelling in lower extremities, anasarca, dizziness, palpitations  GI:  No heartburn, indigestion, abdominal pain, nausea, vomiting, diarrhea, change in bowel habits, loss of appetite  Resp:  No shortness of breath with exertion or at rest. No excess mucus, no productive cough, No non-productive cough, No coughing up of blood.No change in color of mucus.No wheezing.No chest wall deformity  Skin:  no rash or lesions.  GU:  no dysuria, change in color of urine, no urgency or frequency. No flank pain.  Musculoskeletal:  No joint pain or swelling. No decreased range of motion. No back pain.  Psych:  No change in  mood or affect. No depression or anxiety. No memory loss.   Past Medical History:  Diagnosis Date  . Bladder stone   . Bladder stones 07/27/2018  . HLD (hyperlipidemia) 07/27/2018  . HTN (hypertension) 07/27/2018  . Hyperlipemia   . Hypertension   . Sepsis, unspecified organism ([place]) 07/27/2018  . Skin cancer   . Skin cancer 07/27/2018   Past Surgical History:  Procedure Laterality Date  . CYSTOSCOPY  09/24/2011   Procedure: CYSTOSCOPY;  Surgeon: Molli Hazard, MD;  Location: [facility];  Service: Urology;  Laterality: N/A;  CYSTOSCOPY, CYSTOLITHALOPAXY  . DERMABRASION OF FACE    . TRANSURETHRAL RESECTION OF PROSTATE  09/24/2011   Procedure: TRANSURETHRAL RESECTION OF THE PROSTATE WITH GYRUS INSTRUMENTS;  Surgeon: Molli Hazard, MD;  Location: [facility];  Service: Urology;  Laterality: N/A;   Social History:  reports that he quit smoking about 16 years ago. He does not have any smokeless tobacco history on file. He reports current alcohol use. No history on file for drug.  Allergies  Allergen Reactions  . Benadryl [Diphenhydramine Hcl] Swelling    Negative family history stroke, HTN, HLD, cancer.    Prior to Admission medications   Medication Sig Start Date End Date Taking? Authorizing Provider  hydrochlorothiazide (HYDRODIURIL) 25 MG tablet Take 25 mg by mouth daily with breakfast.   Yes [provider]  lisinopril (PRINIVIL,ZESTRIL) 40 MG tablet Take 40 mg by mouth daily with breakfast.   Yes [provider]  pravastatin (PRAVACHOL) 80 MG tablet Take 80 mg by mouth every evening.   Yes [provider]  hyoscyamine (LEVSIN, ANASPAZ) 0.125 MG tablet Take 1 tablet (0.125 mg total) by mouth every 4 (four) hours as needed for cramping (bladder spasms). Patient not taking: Reported on 07/27/2018 09/25/11 10/05/11  Rolan Bucco, MD     Consultants:  None    Procedures/Significant Events:    I have personally reviewed and interpreted all  radiology studies and my findings are as above.   VENTILATOR SETTINGS: None   Cultures 3/17 blood pending 3/17 urine pending    Antimicrobials: Anti-infectives (From admission, onward)   Start     Ordered Stop   07/27/18 0845  cefTRIAXone (ROCEPHIN) 1 g in sodium chloride 0.9 % 100 mL IVPB     07/27/18 0841     07/27/18 0515  cefTRIAXone (ROCEPHIN) 1 g in sodium chloride 0.9 % 100 mL IVPB  Status:  Discontinued     07/27/18 0514 07/27/18 0841       Devices    LINES / TUBES:      Continuous Infusions: . sodium chloride    . cefTRIAXone (ROCEPHIN)  IV    . potassium chloride      Physical Exam: Vitals:   07/27/18 0711 07/27/18 0713 07/27/18 0751 07/27/18 0753  BP:   119/73   Pulse: 93 89 88   Resp: (!) 22 (!) 21 15   Temp:    98.2 F (36.8 C)  TempSrc:    Oral  SpO2: 94% 94% 96%   Weight:      Height:        Wt Readings from Last 3 Encounters:  07/27/18 99.8 kg  09/24/11 109.9 kg  09/16/11 109 kg    General: No acute respiratory distress Eyes: negative scleral hemorrhage, negative anisocoria, negative icterus ENT: Negative Runny nose, negative gingival bleeding, Neck:  Negative scars, masses, torticollis, lymphadenopathy, JVD Lungs: Clear to auscultation bilaterally without wheezes or crackles Cardiovascular: Regular rate and rhythm without murmur gallop or rub normal S1 and S2 Abdomen: negative abdominal pain, nondistended, positive soft, bowel sounds, no rebound, no ascites, no appreciable mass Extremities: No significant cyanosis, clubbing, or edema bilateral lower extremities Skin: Negative rashes, lesions, ulcers Psychiatric:  Negative depression, negative anxiety, negative fatigue, negative mania  Central nervous system:  Cranial nerves II through XII intact, tongue/uvula midline, all extremities muscle strength 5/5, sensation intact throughout, negative dysarthria, negative expressive aphasia, negative receptive aphasia.        Labs on  Admission:  Basic Metabolic Panel: Recent Labs  Lab 07/27/18 0515  NA 141  K 2.9*  CL 106  CO2 23  GLUCOSE 146*  BUN 26*  CREATININE 1.33*  CALCIUM 8.8*   Liver Function Tests: Recent Labs  Lab 07/27/18 0515  AST 27  ALT 22  ALKPHOS 65  BILITOT 1.0  PROT 6.6  ALBUMIN 3.2*   No results for input(s): LIPASE, AMYLASE in the last 168 hours. No results for input(s): AMMONIA in the last 168 hours. CBC: Recent Labs  Lab 07/27/18 0515  WBC 14.7*  NEUTROABS 13.5*  HGB 14.4  HCT 43.6  MCV 98.2  PLT 125*   Cardiac Enzymes: Recent Labs  Lab 07/27/18 0515  TROPONINI 0.06*    BNP (last 3 results) No results for input(s): BNP in the last 8760 hours.  ProBNP (last 3 results) No results for input(s): PROBNP in the last 8760 hours.  CBG: No results for input(s): GLUCAP in the last 168 hours.  Radiological Exams on Admission: Dg Chest Port 1 View  Result Date: 07/27/2018  CLINICAL DATA:  Fever and hypoxia with sepsis EXAM: PORTABLE CHEST 1 VIEW COMPARISON:  09/16/2011 FINDINGS: Normal heart size. Negative mediastinal contours. There is no edema, consolidation, effusion, or pneumothorax. IMPRESSION: No evidence of acute disease. Electronically Signed   By: Monte Fantasia M.D.   On: 07/27/2018 05:35   Ct Renal Stone Study  Result Date: 07/27/2018 CLINICAL DATA:  Fever weakness and chills. Urosepsis and scrotal inflammation. Evaluate for scrotal emphysema EXAM: CT ABDOMEN AND PELVIS WITHOUT CONTRAST TECHNIQUE: Multidetector CT imaging of the abdomen and pelvis was performed following the standard protocol without IV contrast. COMPARISON:  None. FINDINGS: Lower chest:  No contributory findings. Hepatobiliary: Hepatic steatosis. There are a few subcentimeter low densities that are too small for densitometry.Cholelithiasis. The gallbladder is partially distended. No inflammatory changes Pancreas: Unremarkable. Spleen: Unremarkable. Adrenals/Urinary Tract: Negative adrenals. No  hydronephrosis or ureteral stone. No asymmetric perinephric stranding. Small left hilar calcification appears atherosclerotic on sagittal reformats. Bladder is deviated towards the right with no superimposed wall thickening. Stomach/Bowel: No obstruction or inflammatory changes. Sigmoid diverticulosis. Vascular/Lymphatic: Diffuse atherosclerotic calcification along the aorta and iliacs. No mass or adenopathy. Reproductive:Enlarged prostate with indistinct adjacent fat. Other: Large left inguinal hernia containing sigmoid colon and fat. Soft tissue density rim around a central fatty density is from remote epiploic appendagitis. Shallow and fatty right inguinal hernia. Musculoskeletal: No acute abnormalities. IMPRESSION: 1. History of UTI with findings of prostatitis. 2. History of scrotal enlargement which is from a large left inguinal hernia containing sigmoid colon. No evidence of scrotal cellulitis. 3. Hepatic steatosis, cholelithiasis, and atherosclerosis. Electronically Signed   By: Monte Fantasia M.D.   On: 07/27/2018 06:53    EKG: Independently reviewed.  Sinus tachycardia  Assessment/Plan Active Problems:   Bladder stones   HLD (hyperlipidemia)   HTN (hypertension)   Skin cancer   Sepsis, unspecified organism ([place])  Sepsis unspecified organism -Patient meets criteria for sepsis on admission temp> 38 C, HR> 90BMP, WBC> 12 -Most likely urosepsis continue current antibiotic - Blood/urine cultures pending  HTN -BP controlled without HTN medication we will monitor closely  HLD -Pravastatin 80 mg daily  Hypokalemia -Potassium 50 mEq recheck  K/Mg1500  Bladder stones - Currently asymptomatic    Code Status: Full (DVT Prophylaxis: Lovenox Family Communication: Wife at bedside Disposition Plan: TBD   Data Reviewed: Care during the described time interval was provided by me .  I have reviewed this patient's available data, including medical history, events of note, physical  examination, and all test results as part of my evaluation.   Time spent: 60 min  [place], [facility] Pager [phone]

## 2018-07-27 NOTE — ED Notes (Addendum)
Date and time results received: 07/27/18 0613 (use smartphrase ".now" to insert current time)  Test: Lactic Acid   Critical Value: 2.2   Name of Provider Notified: Rancour MD  Orders Received? Or Actions Taken?: waiting on orders

## 2018-07-27 NOTE — ED Notes (Signed)
Pt is aware a urine sample is needed and has a urinal at bedside.  

## 2018-07-27 NOTE — ED Notes (Signed)
Spoke with lab- able to add on Mag, HIV antibody towards already drawn labs.

## 2018-07-27 NOTE — ED Notes (Signed)
Date and time results received: 07/27/18  (use smartphrase ".now" to insert current time)  Test: troponin Critical Value: 0.06  Name of Provider Notified: Annie Main Rancour  Orders Received? Or Actions Taken?: none

## 2018-07-27 NOTE — ED Notes (Signed)
Date and time results received: 07/27/18  (use smartphrase ".now" to insert current time)  Test: Lactic Acid Critical Value: 2.2  Name of Provider Notified: Annie Main Rancour  Orders Received? Or Actions Taken?: continue to recheck

## 2018-07-27 NOTE — ED Provider Notes (Signed)
[facility] DEPT Provider Note   CSN: [id number] Arrival date & time: 07/27/18  0455    History   Chief Complaint Chief Complaint  Patient presents with  . Fever    HPI Gregg Callahan is a 68 y.o. male.     Patient presents from home 3-day history of urinary frequency, chills, urgency, generalized weakness.  Has not checked his temperature at home but has had chills and feeling hot.  No nausea or vomiting.  No chest pain or shortness of breath.  He denies any pain with urination or blood in urine.  He is concerned that he has UTI because of his urgency and frequency and chills and weakness.  He denies any abdominal pain, back pain, chest pain, shortness of breath.  States he has no travel outside the country or been around anybody has been sick.  EMS reports he was hypoxic but denied any cough or fever.  Denies any shortness of breath or chest pain.  Patient does have a history of kidney stones and skin cancer in the past.  The history is provided by the patient and the EMS personnel.  Fever  Associated symptoms: chills, dysuria and myalgias   Associated symptoms: no cough, no headaches, no nausea and no rash     Past Medical History:  Diagnosis Date  . Bladder stone   . Hyperlipemia   . Hypertension   . Skin cancer     There are no active problems to display for this patient.   Past Surgical History:  Procedure Laterality Date  . CYSTOSCOPY  09/24/2011   Procedure: CYSTOSCOPY;  Surgeon: Molli Hazard, MD;  Location: [facility];  Service: Urology;  Laterality: N/A;  CYSTOSCOPY, CYSTOLITHALOPAXY  . DERMABRASION OF FACE    . TRANSURETHRAL RESECTION OF PROSTATE  09/24/2011   Procedure: TRANSURETHRAL RESECTION OF THE PROSTATE WITH GYRUS INSTRUMENTS;  Surgeon: Molli Hazard, MD;  Location: [facility];  Service: Urology;  Laterality: N/A;        Home Medications    Prior to Admission medications   Medication Sig Start Date End Date  Taking? Authorizing Provider  hydrochlorothiazide (HYDRODIURIL) 25 MG tablet Take 25 mg by mouth daily with breakfast.    [provider]  hyoscyamine (LEVSIN, ANASPAZ) 0.125 MG tablet Take 1 tablet (0.125 mg total) by mouth every 4 (four) hours as needed for cramping (bladder spasms). 09/25/11 10/05/11  Rolan Bucco, MD  lisinopril (PRINIVIL,ZESTRIL) 40 MG tablet Take 40 mg by mouth daily with breakfast.    [provider]  pravastatin (PRAVACHOL) 80 MG tablet Take 80 mg by mouth every evening.    [provider]    Family History No family history on file.  Social History Social History   Tobacco Use  . Smoking status: Former Smoker    Last attempt to quit: 09/15/2001    Years since quitting: 16.8  Substance Use Topics  . Alcohol use: Yes    Comment: 4 oz daily  . Drug use: Not on file     Allergies   Benadryl [diphenhydramine hcl]   Review of Systems Review of Systems  Constitutional: Positive for activity change, appetite change, chills and fever.  Respiratory: Negative for cough and shortness of breath.   Gastrointestinal: Negative for abdominal pain and nausea.  Genitourinary: Positive for difficulty urinating, dysuria, frequency, scrotal swelling and urgency. Negative for flank pain and hematuria.  Musculoskeletal: Positive for arthralgias and myalgias. Negative for back pain.  Skin: Negative for  rash.  Neurological: Positive for weakness. Negative for dizziness and headaches.    all other systems are negative except as noted in the HPI and PMH.    Physical Exam Updated Vital Signs BP 138/63 (BP Location: Left Arm)   Pulse (!) 105   Temp (!) 101.6 F (38.7 C) (Rectal)   Resp (!) 23   Ht 6' (1.829 m)   Wt 99.8 kg   SpO2 95%   BMI 29.84 kg/m   Physical Exam Vitals signs and nursing note reviewed.  Constitutional:      General: He is not in acute distress.    Appearance: He is well-developed. He is ill-appearing.  HENT:      Head: Normocephalic and atraumatic.     Nose: Nose normal. No congestion or rhinorrhea.     Mouth/Throat:     Mouth: Mucous membranes are dry.     Pharynx: No oropharyngeal exudate.  Eyes:     Conjunctiva/sclera: Conjunctivae normal.     Pupils: Pupils are equal, round, and reactive to light.  Neck:     Musculoskeletal: Normal range of motion and neck supple.     Comments: No meningismus. Cardiovascular:     Rate and Rhythm: Normal rate and regular rhythm.     Heart sounds: Normal heart sounds. No murmur.  Pulmonary:     Effort: Pulmonary effort is normal. No respiratory distress.     Breath sounds: Normal breath sounds.  Abdominal:     Palpations: Abdomen is soft.     Tenderness: There is no abdominal tenderness. There is no guarding or rebound.  Genitourinary:    Comments: Massively enlarged scrotum that is erythematous.  Patient states it is chronically this appearance and is nontender. No crepitance.  Slight areas of erythema to the buttocks.  There is no fluctuance or crepitance to the perineum Musculoskeletal: Normal range of motion.        General: No tenderness.  Skin:    General: Skin is warm.     Capillary Refill: Capillary refill takes less than 2 seconds.  Neurological:     General: No focal deficit present.     Mental Status: He is alert and oriented to person, place, and time. Mental status is at baseline.     Cranial Nerves: No cranial nerve deficit.     Motor: No abnormal muscle tone.     Coordination: Coordination normal.     Comments: No ataxia on finger to nose bilaterally. No pronator drift. 5/5 strength throughout. CN 2-12 intact.Equal grip strength. Sensation intact.   Psychiatric:        Behavior: Behavior normal.      ED Treatments / Results  Labs (all labs ordered are listed, but only abnormal results are displayed) Labs Reviewed  LACTIC ACID, PLASMA - Abnormal; Notable for the following components:      Result Value   Lactic Acid, Venous 2.2  (*)    All other components within normal limits  COMPREHENSIVE METABOLIC PANEL - Abnormal; Notable for the following components:   Potassium 2.9 (*)    Glucose, Bld 146 (*)    BUN 26 (*)    Creatinine, Ser 1.33 (*)    Calcium 8.8 (*)    Albumin 3.2 (*)    GFR calc non Af Amer 54 (*)    All other components within normal limits  CBC WITH DIFFERENTIAL/PLATELET - Abnormal; Notable for the following components:   WBC 14.7 (*)    Platelets 125 (*)  Neutro Abs 13.5 (*)    Lymphs Abs 0.3 (*)    All other components within normal limits  URINALYSIS, ROUTINE W REFLEX MICROSCOPIC - Abnormal; Notable for the following components:   Color, Urine AMBER (*)    Hgb urine dipstick MODERATE (*)    Ketones, ur 5 (*)    Protein, ur 30 (*)    Nitrite POSITIVE (*)    Leukocytes,Ua MODERATE (*)    WBC, UA >50 (*)    Bacteria, UA MANY (*)    All other components within normal limits  TROPONIN I - Abnormal; Notable for the following components:   Troponin I 0.06 (*)    All other components within normal limits  CULTURE, BLOOD (ROUTINE X 2)  CULTURE, BLOOD (ROUTINE X 2)  URINE CULTURE  URINE CULTURE  LACTIC ACID, PLASMA  MAGNESIUM  HIV ANTIBODY (ROUTINE TESTING W REFLEX)  BASIC METABOLIC PANEL  MAGNESIUM  LACTIC ACID, PLASMA  LACTIC ACID, PLASMA  CBC  CREATININE, SERUM    EKG EKG Interpretation  Date/Time:  Tuesday July 27 2018 05:08:27 EDT Ventricular Rate:  109 PR Interval:    QRS Duration: 80 QT Interval:  314 QTC Calculation: 423 R Axis:   11 Text Interpretation:  Sinus tachycardia Anteroseptal infarct, age indeterminate Minimal ST depression, lateral leads Rate faster Nonspecific ST abnormality Confirmed by Ezequiel Essex [phone]) on 07/27/2018 5:19:12 AM   Radiology Dg Chest Port 1 View  Result Date: 07/27/2018 CLINICAL DATA:  Fever and hypoxia with sepsis EXAM: PORTABLE CHEST 1 VIEW COMPARISON:  09/16/2011 FINDINGS: Normal heart size. Negative mediastinal contours.  There is no edema, consolidation, effusion, or pneumothorax. IMPRESSION: No evidence of acute disease. Electronically Signed   By: Monte Fantasia M.D.   On: 07/27/2018 05:35   Ct Renal Stone Study  Result Date: 07/27/2018 CLINICAL DATA:  Fever weakness and chills. Urosepsis and scrotal inflammation. Evaluate for scrotal emphysema EXAM: CT ABDOMEN AND PELVIS WITHOUT CONTRAST TECHNIQUE: Multidetector CT imaging of the abdomen and pelvis was performed following the standard protocol without IV contrast. COMPARISON:  None. FINDINGS: Lower chest:  No contributory findings. Hepatobiliary: Hepatic steatosis. There are a few subcentimeter low densities that are too small for densitometry.Cholelithiasis. The gallbladder is partially distended. No inflammatory changes Pancreas: Unremarkable. Spleen: Unremarkable. Adrenals/Urinary Tract: Negative adrenals. No hydronephrosis or ureteral stone. No asymmetric perinephric stranding. Small left hilar calcification appears atherosclerotic on sagittal reformats. Bladder is deviated towards the right with no superimposed wall thickening. Stomach/Bowel: No obstruction or inflammatory changes. Sigmoid diverticulosis. Vascular/Lymphatic: Diffuse atherosclerotic calcification along the aorta and iliacs. No mass or adenopathy. Reproductive:Enlarged prostate with indistinct adjacent fat. Other: Large left inguinal hernia containing sigmoid colon and fat. Soft tissue density rim around a central fatty density is from remote epiploic appendagitis. Shallow and fatty right inguinal hernia. Musculoskeletal: No acute abnormalities. IMPRESSION: 1. History of UTI with findings of prostatitis. 2. History of scrotal enlargement which is from a large left inguinal hernia containing sigmoid colon. No evidence of scrotal cellulitis. 3. Hepatic steatosis, cholelithiasis, and atherosclerosis. Electronically Signed   By: Monte Fantasia M.D.   On: 07/27/2018 06:53    Procedures Procedures  (including critical care time)  Medications Ordered in ED Medications  cefTRIAXone (ROCEPHIN) 1 g in sodium chloride 0.9 % 100 mL IVPB (has no administration in time range)  acetaminophen (TYLENOL) tablet 650 mg (has no administration in time range)     Initial Impression / Assessment and Plan / ED Course  I have reviewed the triage  vital signs and the nursing notes.  Pertinent labs & imaging results that were available during my care of the patient were reviewed by me and considered in my medical decision making (see chart for details).       Patient with urinary frequency and urgency for the past several days with chills.  He is febrile, tachycardic, hypoxic and tachypneic on arrival.  Code sepsis activated.  He will be given broad-spectrum antibiotics and IV fluids.  He is maintaining his blood pressure mental status.  We will obtain imaging to rule out soft tissue gas in his perineum and scrotum.  CT negative for soft tissue gas or ureteral obstruction. Low suspicion for Fourneir's gangrene. UA consistent with infection. Hypokalemia, leukocytosis, and AKI noted.  HR improving with IVF. Lactate improving.  BP and mental status remain stable.   Admission d/w Dr. Sherral Hammers.  CRITICAL CARE Performed by: Ezequiel Essex Total critical care time: 35 minutes Critical care time was exclusive of separately billable procedures and treating other patients. Critical care was necessary to treat or prevent imminent or life-threatening deterioration. Critical care was time spent personally by me on the following activities: development of treatment plan with patient and/or surrogate as well as nursing, discussions with consultants, evaluation of patient's response to treatment, examination of patient, obtaining history from patient or surrogate, ordering and performing treatments and interventions, ordering and review of laboratory studies, ordering and review of radiographic studies, pulse  oximetry and re-evaluation of patient's condition.   Final Clinical Impressions(s) / ED Diagnoses   Final diagnoses:  Sepsis with acute renal failure without septic shock, due to unspecified organism, unspecified acute renal failure type [facility])    ED Discharge Orders    None       Ezequiel Essex, MD 07/27/18 [phone]

## 2018-07-27 NOTE — ED Notes (Signed)
Attending at bedside.

## 2018-07-27 NOTE — ED Notes (Signed)
ED TO INPATIENT HANDOFF REPORT  ED Nurse Name and Phone #: Casey Burkitt [phone]  S Name/Age/Gender Gregg Callahan 68 y.o. male Room/Bed: WA16/WA16  Code Status   Code Status: Full Code  Home/SNF/Other Home Patient oriented to: self, place, time and situation Is this baseline? Yes   Triage Complete: Triage complete  Chief Complaint Fever, chills, Urinary frequency and diffculty starting urine  Triage Note Pt came to the ED from home with complaints of weakness, chills, and feeling like he was running a fever  No nausea or  Vomiting.  Pt was given 481ml of normal saline en route to ed   Allergies Allergies  Allergen Reactions  . Benadryl [Diphenhydramine Hcl] Swelling    Level of Care/Admitting Diagnosis ED Disposition    ED Disposition Condition [facility] Area: [place] [[id number]]  Level of Care: Stepdown [14]  Admit to SDU based on following criteria: Other see comments  Comments: Urosepsis  Diagnosis: Sepsis, unspecified organism [facility]) [[id number]]  Admitting Physician: Allie Bossier [[id number]]  Attending Physician: Allie Bossier [[id number]]  Estimated length of stay: 5 - 7 days  Certification:: I certify this patient will need inpatient services for at least 2 midnights  PT Class (Do Not Modify): Inpatient [101]  PT Acc Code (Do Not Modify): Private [1]       B Medical/Surgery History Past Medical History:  Diagnosis Date  . Bladder stone   . Bladder stones 07/27/2018  . HLD (hyperlipidemia) 07/27/2018  . HTN (hypertension) 07/27/2018  . Hyperlipemia   . Hypertension   . Sepsis, unspecified organism ([place]) 07/27/2018  . Skin cancer   . Skin cancer 07/27/2018   Past Surgical History:  Procedure Laterality Date  . CYSTOSCOPY  09/24/2011   Procedure: CYSTOSCOPY;  Surgeon: Molli Hazard, MD;  Location: [facility];  Service: Urology;  Laterality: N/A;  CYSTOSCOPY, CYSTOLITHALOPAXY  . DERMABRASION OF FACE    . TRANSURETHRAL  RESECTION OF PROSTATE  09/24/2011   Procedure: TRANSURETHRAL RESECTION OF THE PROSTATE WITH GYRUS INSTRUMENTS;  Surgeon: Molli Hazard, MD;  Location: [facility];  Service: Urology;  Laterality: N/A;     A IV Location/Drains/Wounds Patient Lines/Drains/Airways Status   Active Line/Drains/Airways    Name:   Placement date:   Placement time:   Site:   Days:   Peripheral IV 07/27/18 Left Antecubital   07/27/18    0521    Antecubital   less than 1   Peripheral IV 07/27/18 Right Forearm   07/27/18    0431    Forearm   less than 1   Urethral Catheter Coude 24 Fr.   09/24/11    1110    Coude   2498   Incision 09/24/11 N/A Other (Comment)   09/24/11    0907     2498          Intake/Output Last 24 hours  Intake/Output Summary (Last 24 hours) at 07/27/2018 0931 Last data filed at 07/27/2018 3007 Gross per 24 hour  Intake 2100 ml  Output -  Net 2100 ml    Labs/Imaging Results for orders placed or performed during the hospital encounter of 07/27/18 (from the past 48 hour(s))  Lactic acid, plasma     Status: Abnormal   Collection Time: 07/27/18  5:15 AM  Result Value Ref Range   Lactic Acid, Venous 2.2 (HH) 0.5 - 1.9 mmol/L    Comment: CRITICAL RESULT CALLED TO, READ BACK BY AND VERIFIED WITH: NASH,J. RN  AT 2563 07/27/18 MULLINS,T Performed at [facility], [place] [address], [place], [place] [address]   Comprehensive metabolic panel     Status: Abnormal   Collection Time: 07/27/18  5:15 AM  Result Value Ref Range   Sodium 141 135 - 145 mmol/L   Potassium 2.9 (L) 3.5 - 5.1 mmol/L   Chloride 106 98 - 111 mmol/L   CO2 23 22 - 32 mmol/L   Glucose, Bld 146 (H) 70 - 99 mg/dL   BUN 26 (H) 8 - 23 mg/dL   Creatinine, Ser 1.33 (H) 0.61 - 1.24 mg/dL   Calcium 8.8 (L) 8.9 - 10.3 mg/dL   Total Protein 6.6 6.5 - 8.1 g/dL   Albumin 3.2 (L) 3.5 - 5.0 g/dL   AST 27 15 - 41 U/L   ALT 22 0 - 44 U/L   Alkaline Phosphatase 65 38 - 126 U/L   Total Bilirubin 1.0 0.3 - 1.2 mg/dL    GFR calc non Af Amer 54 (L) >60 mL/min   GFR calc Af Amer >60 >60 mL/min   Anion gap 12 5 - 15    Comment: Performed at [facility], [place] [address] [place], [place] [address]  CBC WITH DIFFERENTIAL     Status: Abnormal   Collection Time: 07/27/18  5:15 AM  Result Value Ref Range   WBC 14.7 (H) 4.0 - 10.5 K/uL   RBC 4.44 4.22 - 5.81 MIL/uL   Hemoglobin 14.4 13.0 - 17.0 g/dL   HCT 43.6 39.0 - 52.0 %   MCV 98.2 80.0 - 100.0 fL   MCH 32.4 26.0 - 34.0 pg   MCHC 33.0 30.0 - 36.0 g/dL   RDW 12.8 11.5 - 15.5 %   Platelets 125 (L) 150 - 400 K/uL    Comment: REPEATED TO VERIFY Immature Platelet Fraction may be clinically indicated, consider ordering this additional test [id number]    nRBC 0.0 0.0 - 0.2 %   Neutrophils Relative % 91 %   Neutro Abs 13.5 (H) 1.7 - 7.7 K/uL   Lymphocytes Relative 2 %   Lymphs Abs 0.3 (L) 0.7 - 4.0 K/uL   Monocytes Relative 6 %   Monocytes Absolute 0.9 0.1 - 1.0 K/uL   Eosinophils Relative 0 %   Eosinophils Absolute 0.0 0.0 - 0.5 K/uL   Basophils Relative 0 %   Basophils Absolute 0.0 0.0 - 0.1 K/uL   Immature Granulocytes 1 %   Abs Immature Granulocytes 0.07 0.00 - 0.07 K/uL    Comment: Performed at [facility], [place] [address], [place], [place] [address]  Urinalysis, Routine w reflex microscopic     Status: Abnormal   Collection Time: 07/27/18  5:15 AM  Result Value Ref Range   Color, Urine AMBER (A) YELLOW    Comment: BIOCHEMICALS MAY BE AFFECTED BY COLOR   APPearance CLEAR CLEAR   Specific Gravity, Urine 1.018 1.005 - 1.030   pH 5.0 5.0 - 8.0   Glucose, UA NEGATIVE NEGATIVE mg/dL   Hgb urine dipstick MODERATE (A) NEGATIVE   Bilirubin Urine NEGATIVE NEGATIVE   Ketones, ur 5 (A) NEGATIVE mg/dL   Protein, ur 30 (A) NEGATIVE mg/dL   Nitrite POSITIVE (A) NEGATIVE   Leukocytes,Ua MODERATE (A) NEGATIVE   RBC / HPF 0-5 0 - 5 RBC/hpf   WBC, UA >50 (H) 0 - 5 WBC/hpf   Bacteria, UA MANY (A) NONE SEEN   Squamous  Epithelial / LPF 0-5 0 - 5  Mucus PRESENT     Comment: Performed at [facility], [place] [address] [place], [place] [address]  Troponin I - ONCE - STAT     Status: Abnormal   Collection Time: 07/27/18  5:15 AM  Result Value Ref Range   Troponin I 0.06 (HH) <0.03 ng/mL    Comment: CRITICAL RESULT CALLED TO, READ BACK BY AND VERIFIED WITH: NASH,J. RN AT [phone] 07/27/18 MULLINS,T Performed at [facility], [place] [address], [place], [place] [address]   Magnesium     Status: None   Collection Time: 07/27/18  5:15 AM  Result Value Ref Range   Magnesium 2.0 1.7 - 2.4 mg/dL    Comment: Performed at [facility], [place] [address] [place], [place] [address]  Lactic acid, plasma     Status: None   Collection Time: 07/27/18  7:53 AM  Result Value Ref Range   Lactic Acid, Venous 1.3 0.5 - 1.9 mmol/L    Comment: Performed at [facility], [place] [address] [place], [place] [address]   Dg Chest Port 1 View  Result Date: 07/27/2018 CLINICAL DATA:  Fever and hypoxia with sepsis EXAM: PORTABLE CHEST 1 VIEW COMPARISON:  09/16/2011 FINDINGS: Normal heart size. Negative mediastinal contours. There is no edema, consolidation, effusion, or pneumothorax. IMPRESSION: No evidence of acute disease. Electronically Signed   By: Monte Fantasia M.D.   On: 07/27/2018 05:35   Ct Renal Stone Study  Result Date: 07/27/2018 CLINICAL DATA:  Fever weakness and chills. Urosepsis and scrotal inflammation. Evaluate for scrotal emphysema EXAM: CT ABDOMEN AND PELVIS WITHOUT CONTRAST TECHNIQUE: Multidetector CT imaging of the abdomen and pelvis was performed following the standard protocol without IV contrast. COMPARISON:  None. FINDINGS: Lower chest:  No contributory findings. Hepatobiliary: Hepatic steatosis. There are a few subcentimeter low densities that are too small for densitometry.Cholelithiasis. The gallbladder is partially distended. No inflammatory  changes Pancreas: Unremarkable. Spleen: Unremarkable. Adrenals/Urinary Tract: Negative adrenals. No hydronephrosis or ureteral stone. No asymmetric perinephric stranding. Small left hilar calcification appears atherosclerotic on sagittal reformats. Bladder is deviated towards the right with no superimposed wall thickening. Stomach/Bowel: No obstruction or inflammatory changes. Sigmoid diverticulosis. Vascular/Lymphatic: Diffuse atherosclerotic calcification along the aorta and iliacs. No mass or adenopathy. Reproductive:Enlarged prostate with indistinct adjacent fat. Other: Large left inguinal hernia containing sigmoid colon and fat. Soft tissue density rim around a central fatty density is from remote epiploic appendagitis. Shallow and fatty right inguinal hernia. Musculoskeletal: No acute abnormalities. IMPRESSION: 1. History of UTI with findings of prostatitis. 2. History of scrotal enlargement which is from a large left inguinal hernia containing sigmoid colon. No evidence of scrotal cellulitis. 3. Hepatic steatosis, cholelithiasis, and atherosclerosis. Electronically Signed   By: Monte Fantasia M.D.   On: 07/27/2018 06:53    Pending Labs Unresulted Labs (From admission, onward)    Start     Ordered   08/03/18 0500  Creatinine, serum  (enoxaparin (LOVENOX)    CrCl >/= 30 ml/min)  Weekly,   R    Comments:  while on enoxaparin therapy    07/27/18 0841   07/28/18 0500  Protime-INR  Tomorrow morning,   R     07/27/18 0841   07/28/18 0500  Cortisol-am, blood  Tomorrow morning,   R     07/27/18 0841   07/28/18 0500  Procalcitonin  Tomorrow morning,   R     07/27/18 0841   07/28/18 8676  Basic metabolic panel  Daily,  R     07/27/18 0841   07/28/18 0500  CBC  Daily,   R     07/27/18 0841   07/28/18 0500  Magnesium  Daily,   R     07/27/18 0841   07/27/18 5465  Basic metabolic panel  Once,   R     07/27/18 0841   07/27/18 1500  Magnesium  Once,   R     07/27/18 0841   07/27/18 0838  Lactic  acid, plasma  STAT Now then every 3 hours,   STAT     07/27/18 0841   07/27/18 0835  HIV antibody (Routine Testing)  Once,   R     07/27/18 0841   07/27/18 0515  Blood Culture (routine x 2)  BLOOD CULTURE X 2,   STAT     07/27/18 0514   07/27/18 0515  Urine culture  ONCE - STAT,   STAT     07/27/18 0514          Vitals/Pain Today's Vitals   07/27/18 0753 07/27/18 0759 07/27/18 0800 07/27/18 0830  BP:   110/64 112/68  Pulse:   85 82  Resp:   (!) 22 17  Temp: 98.2 F (36.8 C)     TempSrc: Oral     SpO2:   94% 93%  Weight:      Height:      PainSc:  0-No pain      Isolation Precautions No active isolations  Medications Medications  potassium chloride 10 mEq in 100 mL IVPB (10 mEq Intravenous New Bag/Given 07/27/18 0916)  0.9 %  sodium chloride infusion ( Intravenous New Bag/Given 07/27/18 0920)  cefTRIAXone (ROCEPHIN) 1 g in sodium chloride 0.9 % 100 mL IVPB (has no administration in time range)  acetaminophen (TYLENOL) tablet 650 mg (has no administration in time range)    Or  acetaminophen (TYLENOL) suppository 650 mg (has no administration in time range)  traMADol (ULTRAM) tablet 50 mg (has no administration in time range)  aspirin EC tablet 81 mg (has no administration in time range)  enoxaparin (LOVENOX) injection 40 mg (has no administration in time range)  acetaminophen (TYLENOL) tablet 650 mg (650 mg Oral Given 07/27/18 0530)  sodium chloride 0.9 % bolus 1,000 mL (0 mLs Intravenous Stopped 07/27/18 0652)  sodium chloride 0.9 % bolus 1,000 mL (0 mLs Intravenous Stopped 07/27/18 0641)    Mobility walks Low fall risk   Focused Assessments Cardiac Assessment Handoff:  Cardiac Rhythm: Sinus tachycardia Lab Results  Component Value Date   TROPONINI 0.06 ([place]) 07/27/2018   No results found for: DDIMER Does the Patient currently have chest pain? No     R Recommendations: See Admitting Provider Note  Report given to: Lani,RN (2W)  Additional Notes:

## 2018-07-27 NOTE — ED Notes (Addendum)
Date and time results received: 07/27/18 0612 (use smartphrase ".now" to insert current time)  Test: Troponin Critical Value: 0.06  Name of Provider Notified: Rancour MD  Orders Received? Or Actions Taken?: Waiting on orders

## 2018-07-27 NOTE — ED Notes (Signed)
Per Sherral Hammers MD , [place] to hold on collect of lactic acid d/t just drawn at 0800- to repeat in 3 hours.

## 2018-07-27 NOTE — ED Notes (Signed)
Family at bedside. 

## 2018-07-27 NOTE — ED Triage Notes (Signed)
Pt came to the ED from home with complaints of weakness, chills, and feeling like he was running a fever  No nausea or  Vomiting.  Pt was given 430ml of normal saline en route to ed

## 2018-07-27 NOTE — ED Notes (Signed)
Bed: HU31 Expected date:  Expected time:  Means of arrival:  Comments: Chills, weakness

## 2018-07-28 ENCOUNTER — Encounter (HOSPITAL_COMMUNITY): Payer: Self-pay

## 2018-07-28 DIAGNOSIS — R7881 Bacteremia: Secondary | ICD-10-CM

## 2018-07-28 LAB — CBC
HCT: 37.8 % — ABNORMAL LOW (ref 39.0–52.0)
Hemoglobin: 12.5 g/dL — ABNORMAL LOW (ref 13.0–17.0)
MCH: 32.8 pg (ref 26.0–34.0)
MCHC: 33.1 g/dL (ref 30.0–36.0)
MCV: 99.2 fL (ref 80.0–100.0)
Platelets: 129 10*3/uL — ABNORMAL LOW (ref 150–400)
RBC: 3.81 MIL/uL — ABNORMAL LOW (ref 4.22–5.81)
RDW: 13 % (ref 11.5–15.5)
WBC: 11.1 10*3/uL — ABNORMAL HIGH (ref 4.0–10.5)
nRBC: 0 % (ref 0.0–0.2)

## 2018-07-28 LAB — URINE CULTURE: Culture: <10000 — AB

## 2018-07-28 LAB — BLOOD CULTURE ID PANEL (REFLEXED)
Acinetobacter baumannii: NOT DETECTED
Candida albicans: NOT DETECTED
Candida glabrata: NOT DETECTED
Candida krusei: NOT DETECTED
Candida parapsilosis: NOT DETECTED
Candida tropicalis: NOT DETECTED
Carbapenem resistance: NOT DETECTED
Enterobacter cloacae complex: NOT DETECTED
Enterobacteriaceae species: DETECTED — AB
Enterococcus species: NOT DETECTED
Escherichia coli: NOT DETECTED
Haemophilus influenzae: NOT DETECTED
Klebsiella oxytoca: DETECTED — AB
Klebsiella pneumoniae: NOT DETECTED
Listeria monocytogenes: NOT DETECTED
Neisseria meningitidis: NOT DETECTED
Proteus species: NOT DETECTED
Pseudomonas aeruginosa: NOT DETECTED
Serratia marcescens: NOT DETECTED
Staphylococcus aureus (BCID): NOT DETECTED
Staphylococcus species: NOT DETECTED
Streptococcus agalactiae: NOT DETECTED
Streptococcus pneumoniae: NOT DETECTED
Streptococcus pyogenes: NOT DETECTED
Streptococcus species: NOT DETECTED

## 2018-07-28 LAB — BASIC METABOLIC PANEL
Anion gap: 9 (ref 5–15)
BUN: 17 mg/dL (ref 8–23)
CALCIUM: 7.6 mg/dL — AB (ref 8.9–10.3)
CO2: 20 mmol/L — AB (ref 22–32)
Chloride: 109 mmol/L (ref 98–111)
Creatinine, Ser: 0.95 mg/dL (ref 0.61–1.24)
GFR calc non Af Amer: >60 mL/min (ref >60)
Glucose, Bld: 124 mg/dL — ABNORMAL HIGH (ref 70–99)
Potassium: 3 mmol/L — ABNORMAL LOW (ref 3.5–5.1)
Sodium: 138 mmol/L (ref 135–145)

## 2018-07-28 LAB — MAGNESIUM: Magnesium: 1.9 mg/dL (ref 1.7–2.4)

## 2018-07-28 LAB — PROTIME-INR
INR: 1.1 (ref 0.8–1.2)
Prothrombin Time: 14.2 s (ref 11.4–15.2)

## 2018-07-28 LAB — PROCALCITONIN: Procalcitonin: 18.3 ng/mL

## 2018-07-28 LAB — CORTISOL-AM, BLOOD: Cortisol - AM: 7.6 ug/dL (ref 6.7–22.6)

## 2018-07-28 MED ORDER — ORAL CARE MOUTH RINSE
15.0000 mL | Freq: Two times a day (BID) | OROMUCOSAL | Status: DC
  Administered 2018-07-28: 15 mL via OROMUCOSAL

## 2018-07-28 MED ORDER — POTASSIUM CHLORIDE CRYS ER 20 MEQ PO TBCR
30.0000 meq | EXTENDED_RELEASE_TABLET | ORAL | Status: AC
  Administered 2018-07-28 (×2): 30 meq via ORAL
  Filled 2018-07-28 (×2): qty 1

## 2018-07-28 MED ORDER — POTASSIUM CHLORIDE CRYS ER 20 MEQ PO TBCR
40.0000 meq | EXTENDED_RELEASE_TABLET | Freq: Every day | ORAL | Status: DC
  Administered 2018-07-29: 40 meq via ORAL
  Filled 2018-07-28: qty 2

## 2018-07-28 MED ORDER — CHLORHEXIDINE GLUCONATE CLOTH 2 % EX PADS: 6.0000 | MEDICATED_PAD | Freq: Every day | CUTANEOUS | Status: DC

## 2018-07-28 NOTE — Progress Notes (Signed)
PHARMACY - PHYSICIAN COMMUNICATION CRITICAL VALUE ALERT - BLOOD CULTURE IDENTIFICATION (BCID)  Gregg Callahan is an 68 y.o. male who presented to [facility] on 07/27/2018 with a chief complaint of 3 days urinary sx, chills, did not check temp at home. Hypoxic per EMS  Assessment: Rocephin 1gm q24 started 3/17, increased to 2gm q24 from 3/18. Met sepsis criteria on admit, hx renal stones, likely urosepsis BCID resulted 2/4 bottles Klebsiella oxytoca, no resistance noted  Name of physician (or Provider) Contacted: Clance Boll NP  Current antibiotics: Rocephin 2gm q24  Changes to prescribed antibiotics recommended:  Patient is on recommended antibiotics - No changes needed  Results for orders placed or performed during the hospital encounter of 07/27/18  Blood Culture ID Panel (Reflexed) (Collected: 07/27/2018  5:20 AM)  Result Value Ref Range   Enterococcus species NOT DETECTED NOT DETECTED   Listeria monocytogenes NOT DETECTED NOT DETECTED   Staphylococcus species NOT DETECTED NOT DETECTED   Staphylococcus aureus (BCID) NOT DETECTED NOT DETECTED   Streptococcus species NOT DETECTED NOT DETECTED   Streptococcus agalactiae NOT DETECTED NOT DETECTED   Streptococcus pneumoniae NOT DETECTED NOT DETECTED   Streptococcus pyogenes NOT DETECTED NOT DETECTED   Acinetobacter baumannii NOT DETECTED NOT DETECTED   Enterobacteriaceae species DETECTED (A) NOT DETECTED   Enterobacter cloacae complex NOT DETECTED NOT DETECTED   Escherichia coli NOT DETECTED NOT DETECTED   Klebsiella oxytoca DETECTED (A) NOT DETECTED   Klebsiella pneumoniae NOT DETECTED NOT DETECTED   Proteus species NOT DETECTED NOT DETECTED   Serratia marcescens NOT DETECTED NOT DETECTED   Carbapenem resistance NOT DETECTED NOT DETECTED   Haemophilus influenzae NOT DETECTED NOT DETECTED   Neisseria meningitidis NOT DETECTED NOT DETECTED   Pseudomonas aeruginosa NOT DETECTED NOT DETECTED   Candida albicans NOT DETECTED NOT  DETECTED   Candida glabrata NOT DETECTED NOT DETECTED   Candida krusei NOT DETECTED NOT DETECTED   Candida parapsilosis NOT DETECTED NOT DETECTED   Candida tropicalis NOT DETECTED NOT DETECTED    [name], [name] L 07/28/2018  12:27 AM

## 2018-07-28 NOTE — Progress Notes (Signed)
PROGRESS NOTE    Gregg Callahan  OKH:[id number] DOB: 07/30/1950 DOA: 07/27/2018 PCP: [name], FNP    Brief Narrative:  68 year old gentleman with history of bladder stone, no history of UTI, hypertension who presented to the emergency room with 3 days of urinary frequency, chills and generalized weakness.  He was found with sepsis in the emergency room.  Was treated with sepsis protocol.  Blood cultures showing gram-negative bacteria.  Urine cultures with no significant growth.  CT scan showed no complications, no hydronephrosis or retention.  Clinically improving today.   Assessment & Plan:   Active Problems:   Bladder stones   HLD (hyperlipidemia)   HTN (hypertension)   Skin cancer   Sepsis, unspecified organism ([place])   Bacteremia due to Gram-negative bacteria  Sepsis: Present on admission.  Due to gram-negative bacteremia.  His one set of blood cultures growing Klebsiella, another blood cultures growing Klebsiella as well as Enterobacteriaceae.  Primary source suspected to be UTI and possibly prostatitis.  Initially received broad-spectrum antibiotics.  Currently remains on Rocephin 2 g daily.  Clinically improving.  Lactic acid normalized.  We will continue Rocephin today.  Repeat blood cultures.  CT scan without any retained stone, hydronephrosis.  Once blood cultures negative, will treat with 2 to 3 weeks of oral fluoroquinolone.  Repeat cultures were ordered today.  Hypokalemia: Replace and monitor levels.  Will check magnesium levels also.  Hypertension: Patient on lisinopril and hydrochlorothiazide at home.  We will continue to hold blood pressure medications due to risk of hypotension.  Blood pressures are acceptable.  Hyperlipidemia: Patient is tolerating pravastatin that he will continue.  Elevated troponins: Patient had borderline elevated troponin with no evidence of ischemia.  Negative EKG.  No chest pain.  Suspect type II non-STEMI.  Patient can be transferred to  general medical bed.  Discontinue telemetry.  DVT prophylaxis: Lovenox Code Status: Full code Family Communication: No family at bedside Disposition Plan: Home.  Anticipate tomorrow.   Consultants:   None.  Procedures:   None.  Antimicrobials:   Rocephin 2 g every 24 hours, 07/27/2018   Subjective: Patient was seen and examined at the bedside.  No overnight events.  Remains afebrile.  Denies any urinary frequency or dysuria.  Denies any difficulty urinating.  No post void residuals.  Objective: Vitals:   07/28/18 0400 07/28/18 0600 07/28/18 0700 07/28/18 0800  BP:  137/61  (!) 150/73  Pulse:  70 70 96  Resp:  (!) 33 (!) 25 20  Temp: 98.8 F (37.1 C)     TempSrc: Oral     SpO2:  94% 95% 94%  Weight:      Height:        Intake/Output Summary (Last 24 hours) at 07/28/2018 1022 Last data filed at 07/28/2018 0756 Gross per 24 hour  Intake 2875.67 ml  Output 100 ml  Net 2775.67 ml   Filed Weights   07/27/18 0504  Weight: 99.8 kg    Examination:  General exam: Appears calm and comfortable  Respiratory system: Clear to auscultation. Respiratory effort normal. Cardiovascular system: S1 & S2 heard, RRR. No JVD, murmurs, rubs, gallops or clicks. No pedal edema. Gastrointestinal system: Abdomen is nondistended, soft and nontender. No organomegaly or masses felt. Normal bowel sounds heard.  Obese and pendulous. Central nervous system: Alert and oriented. No focal neurological deficits. Extremities: Symmetric 5 x 5 power. Skin: No rashes, lesions or ulcers Psychiatry: Judgement and insight appear normal. Mood & affect appropriate.  Data Reviewed: I have personally reviewed following labs and imaging studies  CBC: Recent Labs  Lab 07/27/18 0515 07/28/18 0251  WBC 14.7* 11.1*  NEUTROABS 13.5*  --   HGB 14.4 12.5*  HCT 43.6 37.8*  MCV 98.2 99.2  PLT 125* 627*   Basic Metabolic Panel: Recent Labs  Lab 07/27/18 0515 07/27/18 1511 07/28/18 0251  NA 141  139 138  K 2.9* 3.1* 3.0*  CL 106 108 109  CO2 23 23 20*  GLUCOSE 146* 96 124*  BUN 26* 21 17  CREATININE 1.33* 0.95 0.95  CALCIUM 8.8* 8.0* 7.6*  MG 2.0 1.9 1.9   GFR: Estimated Creatinine Clearance: 89.8 mL/min (by C-G formula based on SCr of 0.95 mg/dL). Liver Function Tests: Recent Labs  Lab 07/27/18 0515  AST 27  ALT 22  ALKPHOS 65  BILITOT 1.0  PROT 6.6  ALBUMIN 3.2*   No results for input(s): LIPASE, AMYLASE in the last 168 hours. No results for input(s): AMMONIA in the last 168 hours. Coagulation Profile: Recent Labs  Lab 07/28/18 0251  INR 1.1   Cardiac Enzymes: Recent Labs  Lab 07/27/18 0515  TROPONINI 0.06*   BNP (last 3 results) No results for input(s): PROBNP in the last 8760 hours. HbA1C: No results for input(s): HGBA1C in the last 72 hours. CBG: No results for input(s): GLUCAP in the last 168 hours. Lipid Profile: No results for input(s): CHOL, HDL, LDLCALC, TRIG, CHOLHDL, LDLDIRECT in the last 72 hours. Thyroid Function Tests: No results for input(s): TSH, T4TOTAL, FREET4, T3FREE, THYROIDAB in the last 72 hours. Anemia Panel: No results for input(s): VITAMINB12, FOLATE, FERRITIN, TIBC, IRON, RETICCTPCT in the last 72 hours. Sepsis Labs: Recent Labs  Lab 07/27/18 0515 07/27/18 0753 07/27/18 1119 07/28/18 0251  PROCALCITON  --   --   --  18.30  LATICACIDVEN 2.2* 1.3 1.2  --     Recent Results (from the past 240 hour(s))  Blood Culture (routine x 2)     Status: None (Preliminary result)   Collection Time: 07/27/18  5:15 AM  Result Value Ref Range Status   Specimen Description   Final    BLOOD LEFT ANTECUBITAL Performed at [facility], [place] [address], [place], [place] [address]    Special Requests   Final    BOTTLES DRAWN AEROBIC AND ANAEROBIC Blood Culture adequate volume Performed at [place] [address] [place], [place] [address]    Culture  Setup Time   Final    AEROBIC BOTTLE  ONLY GRAM NEGATIVE RODS CRITICAL VALUE NOTED.  VALUE IS CONSISTENT WITH PREVIOUSLY REPORTED AND CALLED VALUE. Performed at [facility], [place] [address], [place], [place] [address]    Culture GRAM NEGATIVE RODS  Final   Report Status PENDING  Incomplete  Urine culture     Status: Abnormal   Collection Time: 07/27/18  5:15 AM  Result Value Ref Range Status   Specimen Description   Final    URINE, CLEAN CATCH Performed at [facility], [place] [address] [place], [place] [address]    Special Requests   Final    NONE Performed at [facility], [place] [address] [place], [place] [address]    Culture (A)  Final    <10,000 COLONIES/mL INSIGNIFICANT GROWTH Performed at [place] [address] [place], [place] [address]    Report Status 07/28/2018 FINAL  Final  Blood Culture (routine x 2)  Status: Abnormal (Preliminary result)   Collection Time: 07/27/18  5:20 AM  Result Value Ref Range Status   Specimen Description   Final    BLOOD BLOOD RIGHT FOREARM Performed at [place] [address], [place], [place] [address]    Special Requests   Final    BOTTLES DRAWN AEROBIC AND ANAEROBIC Blood Culture adequate volume Performed at [place] [address], [place], [place] [address]    Culture  Setup Time   Final    GRAM NEGATIVE RODS IN BOTH AEROBIC AND ANAEROBIC BOTTLES CRITICAL RESULT CALLED TO, READ BACK BY AND VERIFIED WITH: TShirlee Latch 0011 07/28/2018 Mena Goes Performed at [facility], [place] [address] [place], [place] [address]    Culture KLEBSIELLA OXYTOCA (A)  Final   Report Status PENDING  Incomplete  Blood Culture ID Panel (Reflexed)     Status: Abnormal   Collection Time: 07/27/18  5:20 AM  Result Value Ref Range Status   Enterococcus species NOT DETECTED NOT DETECTED Final   Listeria monocytogenes NOT DETECTED NOT DETECTED Final   Staphylococcus species NOT  DETECTED NOT DETECTED Final   Staphylococcus aureus (BCID) NOT DETECTED NOT DETECTED Final   Streptococcus species NOT DETECTED NOT DETECTED Final   Streptococcus agalactiae NOT DETECTED NOT DETECTED Final   Streptococcus pneumoniae NOT DETECTED NOT DETECTED Final   Streptococcus pyogenes NOT DETECTED NOT DETECTED Final   Acinetobacter baumannii NOT DETECTED NOT DETECTED Final   Enterobacteriaceae species DETECTED (A) NOT DETECTED Final    Comment: Enterobacteriaceae represent a large family of gram-negative bacteria, not a single organism. CRITICAL RESULT CALLED TO, READ BACK BY AND VERIFIED WITH: T. GREEN,PHARMD 0011 07/28/2018 T. TYSOR    Enterobacter cloacae complex NOT DETECTED NOT DETECTED Final   Escherichia coli NOT DETECTED NOT DETECTED Final   Klebsiella oxytoca DETECTED (A) NOT DETECTED Final    Comment: CRITICAL RESULT CALLED TO, READ BACK BY AND VERIFIED WITH: T. GREEN,PHARMD 0011 07/28/2018 T. TYSOR    Klebsiella pneumoniae NOT DETECTED NOT DETECTED Final   Proteus species NOT DETECTED NOT DETECTED Final   Serratia marcescens NOT DETECTED NOT DETECTED Final   Carbapenem resistance NOT DETECTED NOT DETECTED Final   Haemophilus influenzae NOT DETECTED NOT DETECTED Final   Neisseria meningitidis NOT DETECTED NOT DETECTED Final   Pseudomonas aeruginosa NOT DETECTED NOT DETECTED Final   Candida albicans NOT DETECTED NOT DETECTED Final   Candida glabrata NOT DETECTED NOT DETECTED Final   Candida krusei NOT DETECTED NOT DETECTED Final   Candida parapsilosis NOT DETECTED NOT DETECTED Final   Candida tropicalis NOT DETECTED NOT DETECTED Final    Comment: Performed at [facility], [place]. [address], [place], [place] [address]  MRSA PCR Screening     Status: None   Collection Time: 07/27/18 10:30 AM  Result Value Ref Range Status   MRSA by PCR NEGATIVE NEGATIVE Final    Comment:        The GeneXpert MRSA Assay (FDA approved for NASAL specimens only), is one component of  a comprehensive MRSA colonization surveillance program. It is not intended to diagnose MRSA infection nor to guide or monitor treatment for MRSA infections. Performed at [facility], [place] [address], [place],  [address]          Radiology Studies: Dg Chest Port 1 View  Result Date: 07/27/2018 CLINICAL DATA:  Fever and hypoxia with sepsis EXAM: PORTABLE CHEST 1 VIEW COMPARISON:  09/16/2011 FINDINGS:  Normal heart size. Negative mediastinal contours. There is no edema, consolidation, effusion, or pneumothorax. IMPRESSION: No evidence of acute disease. Electronically Signed   By: Monte Fantasia M.D.   On: 07/27/2018 05:35   Ct Renal Stone Study  Result Date: 07/27/2018 CLINICAL DATA:  Fever weakness and chills. Urosepsis and scrotal inflammation. Evaluate for scrotal emphysema EXAM: CT ABDOMEN AND PELVIS WITHOUT CONTRAST TECHNIQUE: Multidetector CT imaging of the abdomen and pelvis was performed following the standard protocol without IV contrast. COMPARISON:  None. FINDINGS: Lower chest:  No contributory findings. Hepatobiliary: Hepatic steatosis. There are a few subcentimeter low densities that are too small for densitometry.Cholelithiasis. The gallbladder is partially distended. No inflammatory changes Pancreas: Unremarkable. Spleen: Unremarkable. Adrenals/Urinary Tract: Negative adrenals. No hydronephrosis or ureteral stone. No asymmetric perinephric stranding. Small left hilar calcification appears atherosclerotic on sagittal reformats. Bladder is deviated towards the right with no superimposed wall thickening. Stomach/Bowel: No obstruction or inflammatory changes. Sigmoid diverticulosis. Vascular/Lymphatic: Diffuse atherosclerotic calcification along the aorta and iliacs. No mass or adenopathy. Reproductive:Enlarged prostate with indistinct adjacent fat. Other: Large left inguinal hernia containing sigmoid colon and fat. Soft tissue density rim around a central  fatty density is from remote epiploic appendagitis. Shallow and fatty right inguinal hernia. Musculoskeletal: No acute abnormalities. IMPRESSION: 1. History of UTI with findings of prostatitis. 2. History of scrotal enlargement which is from a large left inguinal hernia containing sigmoid colon. No evidence of scrotal cellulitis. 3. Hepatic steatosis, cholelithiasis, and atherosclerosis. Electronically Signed   By: Monte Fantasia M.D.   On: 07/27/2018 06:53        Scheduled Meds: . aspirin EC  81 mg Oral Daily  . Chlorhexidine Gluconate Cloth  6 each Topical Daily  . enoxaparin (LOVENOX) injection  40 mg Subcutaneous Daily  . mouth rinse  15 mL Mouth Rinse BID  . pravastatin  80 mg Oral QPM   Continuous Infusions: . sodium chloride 100 mL/hr at 07/28/18 0756  . cefTRIAXone (ROCEPHIN)  IV Stopped (07/28/18 0542)     LOS: 1 day    Time spent: 25 minutes    Barb Merino, MD [facility] Pager [phone]  If 7PM-7AM, please contact night-coverage [email or web address] Password [facility] 07/28/2018, 10:22 AM

## 2018-07-29 LAB — CULTURE, BLOOD (ROUTINE X 2): Special Requests: ADEQUATE

## 2018-07-29 LAB — BASIC METABOLIC PANEL
Anion gap: 8 (ref 5–15)
BUN: 13 mg/dL (ref 8–23)
CO2: 19 mmol/L — ABNORMAL LOW (ref 22–32)
Calcium: 7.7 mg/dL — ABNORMAL LOW (ref 8.9–10.3)
Chloride: 111 mmol/L (ref 98–111)
Creatinine, Ser: 0.85 mg/dL (ref 0.61–1.24)
GFR calc Af Amer: >60 mL/min (ref >60)
GFR calc non Af Amer: >60 mL/min (ref >60)
Glucose, Bld: 115 mg/dL — ABNORMAL HIGH (ref 70–99)
Potassium: 3.6 mmol/L (ref 3.5–5.1)
SODIUM: 138 mmol/L (ref 135–145)

## 2018-07-29 LAB — CBC
HCT: 38.9 % — ABNORMAL LOW (ref 39.0–52.0)
Hemoglobin: 12.5 g/dL — ABNORMAL LOW (ref 13.0–17.0)
MCH: 32.2 pg (ref 26.0–34.0)
MCHC: 32.1 g/dL (ref 30.0–36.0)
MCV: 100.3 fL — ABNORMAL HIGH (ref 80.0–100.0)
Platelets: 131 10*3/uL — ABNORMAL LOW (ref 150–400)
RBC: 3.88 MIL/uL — ABNORMAL LOW (ref 4.22–5.81)
RDW: 13 % (ref 11.5–15.5)
WBC: 10.9 10*3/uL — AB (ref 4.0–10.5)
nRBC: 0 % (ref 0.0–0.2)

## 2018-07-29 LAB — MAGNESIUM: Magnesium: 1.9 mg/dL (ref 1.7–2.4)

## 2018-07-29 MED ORDER — LEVOFLOXACIN 750 MG PO TABS: 750.0000 mg | ORAL_TABLET | Freq: Every day | ORAL | 0 refills | Status: AC

## 2018-07-29 NOTE — Progress Notes (Signed)
Patient given discharge instructions, and verbalized an understanding of all discharge instructions.  Patient agrees with discharge plan, and is being discharged in stable medical condition.  Patient given transportation via wheelchair. 

## 2018-07-29 NOTE — Discharge Summary (Signed)
Physician Discharge Summary  Gregg Callahan UUV:[id number] DOB: [birth year] DOA: 07/27/2018  PCP: [name], FNP  Admit date: 07/27/2018 Discharge date: 07/29/2018  Admitted From: home  Disposition:  Home   Recommendations for Outpatient Follow-up:  1. Follow up with PCP in 1-2 weeks   Home Health: Not applicable Equipment/Devices: Not applicable  Discharge Condition: Stable CODE STATUS: Full code Diet recommendation: Heart healthy diet  Brief/Interim Summary: 68 year old gentleman with history of hypertension and hyperlipidemia, remote history of urinary stones.  Denies any history of prostatism.  He was admitted to hospital with 3 days of not feeling well and dysuria.  On admission, his urinalysis was abnormal.  He was admitted and treated with antibiotics, blood cultures grew Klebsiella and Enterobacteriaceae.  Repeat blood cultures negative.  Clinically improved.  Urine cultures negative. CT scan of the renal system was done to look for any structural abnormality, he had no kidney stones.  He had no evidence of obstruction or hydronephrosis.  There was some evidence of prostatitis without complication. Since patient is asymptomatic today.  Repeat blood cultures are negative.  Discussed case with infectious disease who recommended 2 weeks of levofloxacin 750 mg once a day by mouth.  Discharge Diagnoses:  Active Problems:   Bladder stones   HLD (hyperlipidemia)   HTN (hypertension)   Skin cancer   Sepsis, unspecified organism ([place])   Bacteremia due to Gram-negative bacteria    Discharge Instructions  Discharge Instructions    Call MD for:   Complete by:  As directed    Difficulty urinating   Call MD for:  temperature >100.4   Complete by:  As directed    Diet - low sodium heart healthy   Complete by:  As directed    Increase activity slowly   Complete by:  As directed      Allergies as of 07/29/2018      Reactions   Benadryl [diphenhydramine Hcl] Swelling       Medication List    STOP taking these medications   hyoscyamine 0.125 MG tablet Commonly known as:  LEVSIN, ANASPAZ     TAKE these medications   hydrochlorothiazide 25 MG tablet Commonly known as:  HYDRODIURIL Take 25 mg by mouth daily with breakfast.   levofloxacin 750 MG tablet Commonly known as:  Levaquin Take 1 tablet (750 mg total) by mouth daily for 14 days.   lisinopril 40 MG tablet Commonly known as:  PRINIVIL,ZESTRIL Take 40 mg by mouth daily with breakfast.   pravastatin 80 MG tablet Commonly known as:  PRAVACHOL Take 80 mg by mouth every evening.      Follow-up Information    [name], FNP Follow up in 2 week(s).   Specialty:  Family Medicine Contact information: [place] [address] [phone]          Allergies  Allergen Reactions  . Benadryl [Diphenhydramine Hcl] Swelling    Consultations:  None    Procedures/Studies: Dg Chest Port 1 View  Result Date: 07/27/2018 CLINICAL DATA:  Fever and hypoxia with sepsis EXAM: PORTABLE CHEST 1 VIEW COMPARISON:  09/16/2011 FINDINGS: Normal heart size. Negative mediastinal contours. There is no edema, consolidation, effusion, or pneumothorax. IMPRESSION: No evidence of acute disease. Electronically Signed   By: Monte Fantasia M.D.   On: 07/27/2018 05:35   Ct Renal Stone Study  Result Date: 07/27/2018 CLINICAL DATA:  Fever weakness and chills. Urosepsis and scrotal inflammation. Evaluate for scrotal emphysema EXAM: CT ABDOMEN AND PELVIS WITHOUT  CONTRAST TECHNIQUE: Multidetector CT imaging of the abdomen and pelvis was performed following the standard protocol without IV contrast. COMPARISON:  None. FINDINGS: Lower chest:  No contributory findings. Hepatobiliary: Hepatic steatosis. There are a few subcentimeter low densities that are too small for densitometry.Cholelithiasis. The gallbladder is partially distended. No inflammatory changes Pancreas: Unremarkable. Spleen: Unremarkable.  Adrenals/Urinary Tract: Negative adrenals. No hydronephrosis or ureteral stone. No asymmetric perinephric stranding. Small left hilar calcification appears atherosclerotic on sagittal reformats. Bladder is deviated towards the right with no superimposed wall thickening. Stomach/Bowel: No obstruction or inflammatory changes. Sigmoid diverticulosis. Vascular/Lymphatic: Diffuse atherosclerotic calcification along the aorta and iliacs. No mass or adenopathy. Reproductive:Enlarged prostate with indistinct adjacent fat. Other: Large left inguinal hernia containing sigmoid colon and fat. Soft tissue density rim around a central fatty density is from remote epiploic appendagitis. Shallow and fatty right inguinal hernia. Musculoskeletal: No acute abnormalities. IMPRESSION: 1. History of UTI with findings of prostatitis. 2. History of scrotal enlargement which is from a large left inguinal hernia containing sigmoid colon. No evidence of scrotal cellulitis. 3. Hepatic steatosis, cholelithiasis, and atherosclerosis. Electronically Signed   By: Monte Fantasia M.D.   On: 07/27/2018 06:53      Subjective: Patient was seen and examined on the day of discharge.  He had no complaints.  He was eager to go home.  Denies any dysuria.  Remains afebrile. He denies any retention or straining of urine. Patient does have a large left-sided inguinal hernia extending to the scrotum which is uncomplicated and he will discuss with his surgeon about it.   Discharge Exam: Vitals:   07/29/18 0554 07/29/18 1400  BP: 140/73 138/75  Pulse: (!) 58 70  Resp: 19 (!) 24  Temp: 97.9 F (36.6 C) 98.4 F (36.9 C)  SpO2: 98% 99%   Vitals:   07/28/18 1427 07/28/18 2059 07/29/18 0554 07/29/18 1400  BP: 132/73 (!) 141/72 140/73 138/75  Pulse: 71 70 (!) 58 70  Resp: 20 20 19  (!) 24  Temp: 98.6 F (37 C) (!) 97.5 F (36.4 C) 97.9 F (36.6 C) 98.4 F (36.9 C)  TempSrc: Oral Oral Oral Oral  SpO2: 97% 99% 98% 99%  Weight:       Height:        General: Pt is alert, awake, not in acute distress Cardiovascular: RRR, S1/S2 +, no rubs, no gallops Respiratory: CTA bilaterally, no wheezing, no rhonchi Abdominal: Soft, NT, ND, bowel sounds +, large complete reducible left inguinal hernia. Extremities: no edema, no cyanosis    The results of significant diagnostics from this hospitalization (including imaging, microbiology, ancillary and laboratory) are listed below for reference.     Microbiology: Recent Results (from the past 240 hour(s))  Blood Culture (routine x 2)     Status: Abnormal   Collection Time: 07/27/18  5:15 AM  Result Value Ref Range Status   Specimen Description   Final    BLOOD LEFT ANTECUBITAL Performed at [facility] [address], [place], [place] [address]    Special Requests   Final    BOTTLES DRAWN AEROBIC AND ANAEROBIC Blood Culture adequate volume Performed at [facility] [address], [place], [place] [address]    Culture  Setup Time   Final    AEROBIC BOTTLE ONLY GRAM NEGATIVE RODS CRITICAL VALUE NOTED.  VALUE IS CONSISTENT WITH PREVIOUSLY REPORTED AND CALLED VALUE.    Culture (A)  Final    KLEBSIELLA OXYTOCA SUSCEPTIBILITIES PERFORMED ON PREVIOUS CULTURE WITHIN  THE LAST 5 DAYS. Performed at [facility], [place] [address], [place], [place] [address]    Report Status 07/29/2018 FINAL  Final  Urine culture     Status: Abnormal   Collection Time: 07/27/18  5:15 AM  Result Value Ref Range Status   Specimen Description   Final    URINE, CLEAN CATCH Performed at [facility], [place] [address], [place], [place] [address]    Special Requests   Final    NONE Performed at [facility], [place] [address] [place], [place] [address]    Culture (A)  Final    <10,000 COLONIES/mL INSIGNIFICANT GROWTH Performed at [facility] [address], [place], [place] [address]    Report Status  07/28/2018 FINAL  Final  Blood Culture (routine x 2)     Status: Abnormal   Collection Time: 07/27/18  5:20 AM  Result Value Ref Range Status   Specimen Description   Final    BLOOD BLOOD RIGHT FOREARM Performed at [facility] [address], [place], [place] [address]    Special Requests   Final    BOTTLES DRAWN AEROBIC AND ANAEROBIC Blood Culture adequate volume Performed at [facility] [address], [place], [place] [address]    Culture  Setup Time   Final    GRAM NEGATIVE RODS IN BOTH AEROBIC AND ANAEROBIC BOTTLES CRITICAL RESULT CALLED TO, READ BACK BY AND VERIFIED WITH: TShirlee Latch 0011 07/28/2018 Mena Goes Performed at [facility], [place] [address] [place], [place] [address]    Culture KLEBSIELLA OXYTOCA (A)  Final   Report Status 07/29/2018 FINAL  Final   Organism ID, Bacteria KLEBSIELLA OXYTOCA  Final      Susceptibility   Klebsiella oxytoca - MIC*    AMPICILLIN RESISTANT Resistant     CEFAZOLIN >=64 RESISTANT Resistant     CEFEPIME <=1 SENSITIVE Sensitive     CEFTAZIDIME <=1 SENSITIVE Sensitive     CEFTRIAXONE <=1 SENSITIVE Sensitive     CIPROFLOXACIN <=0.25 SENSITIVE Sensitive     GENTAMICIN <=1 SENSITIVE Sensitive     IMIPENEM <=0.25 SENSITIVE Sensitive     TRIMETH/SULFA <=20 SENSITIVE Sensitive     AMPICILLIN/SULBACTAM 8 SENSITIVE Sensitive     PIP/TAZO <=4 SENSITIVE Sensitive     Extended ESBL NEGATIVE Sensitive     * KLEBSIELLA OXYTOCA  Blood Culture ID Panel (Reflexed)     Status: Abnormal   Collection Time: 07/27/18  5:20 AM  Result Value Ref Range Status   Enterococcus species NOT DETECTED NOT DETECTED Final   Listeria monocytogenes NOT DETECTED NOT DETECTED Final   Staphylococcus species NOT DETECTED NOT DETECTED Final   Staphylococcus aureus (BCID) NOT DETECTED NOT DETECTED Final   Streptococcus species NOT DETECTED NOT DETECTED Final   Streptococcus agalactiae NOT DETECTED NOT DETECTED Final    Streptococcus pneumoniae NOT DETECTED NOT DETECTED Final   Streptococcus pyogenes NOT DETECTED NOT DETECTED Final   Acinetobacter baumannii NOT DETECTED NOT DETECTED Final   Enterobacteriaceae species DETECTED (A) NOT DETECTED Final    Comment: Enterobacteriaceae represent a large family of gram-negative bacteria, not a single organism. CRITICAL RESULT CALLED TO, READ BACK BY AND VERIFIED WITH: T. GREEN,PHARMD 0011 07/28/2018 T. TYSOR    Enterobacter cloacae complex NOT DETECTED NOT DETECTED Final   Escherichia coli NOT DETECTED NOT DETECTED Final   Klebsiella oxytoca DETECTED (A) NOT DETECTED Final    Comment: CRITICAL RESULT CALLED TO,  READ BACK BY AND VERIFIED WITH: T. GREEN,PHARMD 0011 07/28/2018 T. TYSOR    Klebsiella pneumoniae NOT DETECTED NOT DETECTED Final   Proteus species NOT DETECTED NOT DETECTED Final   Serratia marcescens NOT DETECTED NOT DETECTED Final   Carbapenem resistance NOT DETECTED NOT DETECTED Final   Haemophilus influenzae NOT DETECTED NOT DETECTED Final   Neisseria meningitidis NOT DETECTED NOT DETECTED Final   Pseudomonas aeruginosa NOT DETECTED NOT DETECTED Final   Candida albicans NOT DETECTED NOT DETECTED Final   Candida glabrata NOT DETECTED NOT DETECTED Final   Candida krusei NOT DETECTED NOT DETECTED Final   Candida parapsilosis NOT DETECTED NOT DETECTED Final   Candida tropicalis NOT DETECTED NOT DETECTED Final    Comment: Performed at [facility], [address] [place], [place] [address]  MRSA PCR Screening     Status: None   Collection Time: 07/27/18 10:30 AM  Result Value Ref Range Status   MRSA by PCR NEGATIVE NEGATIVE Final    Comment:        The GeneXpert MRSA Assay (FDA approved for NASAL specimens only), is one component of a comprehensive MRSA colonization surveillance program. It is not intended to diagnose MRSA infection nor to guide or monitor treatment for MRSA infections. Performed at [facility], [place] [address] [place], [place] [address]   Culture, blood (routine x 2)     Status: None (Preliminary result)   Collection Time: 07/28/18  8:22 AM  Result Value Ref Range Status   Specimen Description   Final    BLOOD LEFT HAND Performed at [facility] [address], [place], [place] [address]    Special Requests   Final    BOTTLES DRAWN AEROBIC AND ANAEROBIC Blood Culture adequate volume Performed at [facility] [address] [place], [place] [address]    Culture   Final    NO GROWTH 1 DAY Performed at [facility], [place] [address] [place], [place] [address]    Report Status PENDING  Incomplete  Culture, blood (routine x 2)     Status: None (Preliminary result)   Collection Time: 07/28/18  8:32 AM  Result Value Ref Range Status   Specimen Description   Final    BLOOD RIGHT HAND Performed at [facility] [address], [place], [place] [address]    Special Requests   Final    BOTTLES DRAWN AEROBIC AND ANAEROBIC Blood Culture adequate volume Performed at [facility] [address], [place], [place] [address]    Culture   Final    NO GROWTH 1 DAY Performed at [facility], [place] [address] [place], [place] [address]    Report Status PENDING  Incomplete     Labs: BNP (last 3 results) No results for input(s): BNP in the last 8760 hours. Basic Metabolic Panel: Recent Labs  Lab 07/27/18 0515 07/27/18 1511 07/28/18 0251 07/29/18 0539  NA 141 139 138 138  K 2.9* 3.1* 3.0* 3.6  CL 106 108 109 111  CO2 23 23 20* 19*  GLUCOSE 146* 96 124* 115*  BUN 26* 21 17 13   CREATININE 1.33* 0.95 0.95 0.85  CALCIUM 8.8* 8.0* 7.6* 7.7*  MG 2.0 1.9 1.9 1.9   Liver Function Tests: Recent Labs  Lab 07/27/18 0515  AST 27  ALT 22  ALKPHOS 65  BILITOT 1.0  PROT 6.6  ALBUMIN 3.2*   No results for input(s): LIPASE,  AMYLASE in the last 168 hours. No results for input(s): AMMONIA in the  last 168 hours. CBC: Recent Labs  Lab 07/27/18 0515 07/28/18 0251 07/29/18 0539  WBC 14.7* 11.1* 10.9*  NEUTROABS 13.5*  --   --   HGB 14.4 12.5* 12.5*  HCT 43.6 37.8* 38.9*  MCV 98.2 99.2 100.3*  PLT 125* 129* 131*   Cardiac Enzymes: Recent Labs  Lab 07/27/18 0515  TROPONINI 0.06*   BNP: Invalid input(s): POCBNP CBG: No results for input(s): GLUCAP in the last 168 hours. D-Dimer No results for input(s): DDIMER in the last 72 hours. Hgb A1c No results for input(s): HGBA1C in the last 72 hours. Lipid Profile No results for input(s): CHOL, HDL, LDLCALC, TRIG, CHOLHDL, LDLDIRECT in the last 72 hours. Thyroid function studies No results for input(s): TSH, T4TOTAL, T3FREE, THYROIDAB in the last 72 hours.  Invalid input(s): FREET3 Anemia work up No results for input(s): VITAMINB12, FOLATE, FERRITIN, TIBC, IRON, RETICCTPCT in the last 72 hours. Urinalysis    Component Value Date/Time   COLORURINE AMBER (A) 07/27/2018 0515   APPEARANCEUR CLEAR 07/27/2018 0515   LABSPEC 1.018 07/27/2018 0515   PHURINE 5.0 07/27/2018 0515   GLUCOSEU NEGATIVE 07/27/2018 0515   HGBUR MODERATE (A) 07/27/2018 0515   BILIRUBINUR NEGATIVE 07/27/2018 0515   KETONESUR 5 (A) 07/27/2018 0515   PROTEINUR 30 (A) 07/27/2018 0515   NITRITE POSITIVE (A) 07/27/2018 0515   LEUKOCYTESUR MODERATE (A) 07/27/2018 0515   Sepsis Labs Invalid input(s): PROCALCITONIN,  WBC,  LACTICIDVEN Microbiology Recent Results (from the past 240 hour(s))  Blood Culture (routine x 2)     Status: Abnormal   Collection Time: 07/27/18  5:15 AM  Result Value Ref Range Status   Specimen Description   Final    BLOOD LEFT ANTECUBITAL Performed at [facility], [place] [address], [place], [place] [address]    Special Requests   Final    BOTTLES DRAWN AEROBIC AND ANAEROBIC Blood Culture adequate volume Performed at [facility] [address], [place], [place] [address]    Culture  Setup  Time   Final    AEROBIC BOTTLE ONLY GRAM NEGATIVE RODS CRITICAL VALUE NOTED.  VALUE IS CONSISTENT WITH PREVIOUSLY REPORTED AND CALLED VALUE.    Culture (A)  Final    KLEBSIELLA OXYTOCA SUSCEPTIBILITIES PERFORMED ON PREVIOUS CULTURE WITHIN THE LAST 5 DAYS. Performed at [facility], [place] [address] [place], [place] [address]    Report Status 07/29/2018 FINAL  Final  Urine culture     Status: Abnormal   Collection Time: 07/27/18  5:15 AM  Result Value Ref Range Status   Specimen Description   Final    URINE, CLEAN CATCH Performed at [facility], [place] [address] [place], [place] [address]    Special Requests   Final    NONE Performed at [facility], [place] [address], [place], [place] [address]    Culture (A)  Final    <10,000 COLONIES/mL INSIGNIFICANT GROWTH Performed at [facility] [address] [place], [place] [address]    Report Status 07/28/2018 FINAL  Final  Blood Culture (routine x 2)     Status: Abnormal   Collection Time: 07/27/18  5:20 AM  Result Value Ref Range Status   Specimen Description   Final    BLOOD BLOOD RIGHT FOREARM Performed at [facility] [address], [place], [place] [address]    Special Requests  Final    BOTTLES DRAWN AEROBIC AND ANAEROBIC Blood Culture adequate volume Performed at [facility] [address] [place], [place] [address]    Culture  Setup Time   Final    GRAM NEGATIVE RODS IN BOTH AEROBIC AND ANAEROBIC BOTTLES CRITICAL RESULT CALLED TO, READ BACK BY AND VERIFIED WITH: TShirlee Latch 0011 07/28/2018 Mena Goes Performed at [facility], [place] [address] [place], [place] [address]    Culture KLEBSIELLA OXYTOCA (A)  Final   Report Status 07/29/2018 FINAL  Final   Organism ID, Bacteria KLEBSIELLA OXYTOCA  Final      Susceptibility   Klebsiella oxytoca - MIC*    AMPICILLIN RESISTANT Resistant     CEFAZOLIN >=64 RESISTANT Resistant      CEFEPIME <=1 SENSITIVE Sensitive     CEFTAZIDIME <=1 SENSITIVE Sensitive     CEFTRIAXONE <=1 SENSITIVE Sensitive     CIPROFLOXACIN <=0.25 SENSITIVE Sensitive     GENTAMICIN <=1 SENSITIVE Sensitive     IMIPENEM <=0.25 SENSITIVE Sensitive     TRIMETH/SULFA <=20 SENSITIVE Sensitive     AMPICILLIN/SULBACTAM 8 SENSITIVE Sensitive     PIP/TAZO <=4 SENSITIVE Sensitive     Extended ESBL NEGATIVE Sensitive     * KLEBSIELLA OXYTOCA  Blood Culture ID Panel (Reflexed)     Status: Abnormal   Collection Time: 07/27/18  5:20 AM  Result Value Ref Range Status   Enterococcus species NOT DETECTED NOT DETECTED Final   Listeria monocytogenes NOT DETECTED NOT DETECTED Final   Staphylococcus species NOT DETECTED NOT DETECTED Final   Staphylococcus aureus (BCID) NOT DETECTED NOT DETECTED Final   Streptococcus species NOT DETECTED NOT DETECTED Final   Streptococcus agalactiae NOT DETECTED NOT DETECTED Final   Streptococcus pneumoniae NOT DETECTED NOT DETECTED Final   Streptococcus pyogenes NOT DETECTED NOT DETECTED Final   Acinetobacter baumannii NOT DETECTED NOT DETECTED Final   Enterobacteriaceae species DETECTED (A) NOT DETECTED Final    Comment: Enterobacteriaceae represent a large family of gram-negative bacteria, not a single organism. CRITICAL RESULT CALLED TO, READ BACK BY AND VERIFIED WITH: T. GREEN,PHARMD 0011 07/28/2018 T. TYSOR    Enterobacter cloacae complex NOT DETECTED NOT DETECTED Final   Escherichia coli NOT DETECTED NOT DETECTED Final   Klebsiella oxytoca DETECTED (A) NOT DETECTED Final    Comment: CRITICAL RESULT CALLED TO, READ BACK BY AND VERIFIED WITH: T. GREEN,PHARMD 0011 07/28/2018 T. TYSOR    Klebsiella pneumoniae NOT DETECTED NOT DETECTED Final   Proteus species NOT DETECTED NOT DETECTED Final   Serratia marcescens NOT DETECTED NOT DETECTED Final   Carbapenem resistance NOT DETECTED NOT DETECTED Final   Haemophilus influenzae NOT DETECTED NOT DETECTED Final   Neisseria  meningitidis NOT DETECTED NOT DETECTED Final   Pseudomonas aeruginosa NOT DETECTED NOT DETECTED Final   Candida albicans NOT DETECTED NOT DETECTED Final   Candida glabrata NOT DETECTED NOT DETECTED Final   Candida krusei NOT DETECTED NOT DETECTED Final   Candida parapsilosis NOT DETECTED NOT DETECTED Final   Candida tropicalis NOT DETECTED NOT DETECTED Final    Comment: Performed at [facility], [place] [address] [place], [place] [address]  MRSA PCR Screening     Status: None   Collection Time: 07/27/18 10:30 AM  Result Value Ref Range Status   MRSA by PCR NEGATIVE NEGATIVE Final    Comment:        The GeneXpert MRSA Assay (FDA approved for NASAL specimens only), is one component of a comprehensive  MRSA colonization surveillance program. It is not intended to diagnose MRSA infection nor to guide or monitor treatment for MRSA infections. Performed at [facility], [place] [address] [place], [place] [address]   Culture, blood (routine x 2)     Status: None (Preliminary result)   Collection Time: 07/28/18  8:22 AM  Result Value Ref Range Status   Specimen Description   Final    BLOOD LEFT HAND Performed at [facility] [address] [place], [place] [address]    Special Requests   Final    BOTTLES DRAWN AEROBIC AND ANAEROBIC Blood Culture adequate volume Performed at [facility] [address] [place], [place] [address]    Culture   Final    NO GROWTH 1 DAY Performed at [facility], [place] [address] [place], [place] [address]    Report Status PENDING  Incomplete  Culture, blood (routine x 2)     Status: None (Preliminary result)   Collection Time: 07/28/18  8:32 AM  Result Value Ref Range Status   Specimen Description   Final    BLOOD RIGHT HAND Performed at [facility] [address], [place], [place] [address]    Special Requests   Final    BOTTLES DRAWN AEROBIC AND ANAEROBIC  Blood Culture adequate volume Performed at [facility] [address], [place], [place] [address]    Culture   Final    NO GROWTH 1 DAY Performed at [facility], [place] [address] [place], [place] [address]    Report Status PENDING  Incomplete     Time coordinating discharge: 25 minutes  SIGNED:   Barb Merino, MD  [facility] 07/29/2018, 2:50 PM Pager [phone]  If 7PM-7AM, please contact night-coverage [email or web address] Password [email or web address]

## 2018-08-01 LAB — CULTURE, BLOOD (ROUTINE X 2): Special Requests: ADEQUATE

## 2018-08-02 LAB — CULTURE, BLOOD (ROUTINE X 2)
Culture: NO GROWTH
Culture: NO GROWTH
Special Requests: ADEQUATE
Special Requests: ADEQUATE

## 2019-06-02 ENCOUNTER — Ambulatory Visit: Payer: Medicare Other | Attending: Internal Medicine

## 2019-06-02 ENCOUNTER — Ambulatory Visit: Payer: Medicare Other

## 2019-06-02 DIAGNOSIS — Z23 Encounter for immunization: Secondary | ICD-10-CM | POA: Insufficient documentation

## 2019-06-02 NOTE — Progress Notes (Signed)
   Covid-19 Vaccination Clinic  Name:  Gregg Callahan    MRN: [id number] DOB: 30-Jan-1951  06/02/2019  Gregg Callahan was observed post Covid-19 immunization for 15 minutes without incidence. He was provided with Vaccine Information Sheet and instruction to access the V-Safe system.   Gregg Callahan was instructed to call 911 with any severe reactions post vaccine: Marland Kitchen Difficulty breathing  . Swelling of your face and throat  . A fast heartbeat  . A bad rash all over your body  . Dizziness and weakness    Immunizations Administered    Name Date Dose VIS Date Route   Pfizer COVID-19 Vaccine 06/02/2019  3:54 PM 0.3 mL 04/22/2019 Intramuscular   Manufacturer: [place]   Lot: [id number]   [place]: [id number]

## 2019-06-23 ENCOUNTER — Ambulatory Visit: Payer: Medicare Other | Attending: Internal Medicine

## 2019-06-23 DIAGNOSIS — Z23 Encounter for immunization: Secondary | ICD-10-CM | POA: Insufficient documentation

## 2019-06-23 NOTE — Progress Notes (Signed)
   Covid-19 Vaccination Clinic  Name:  Gregg Callahan    MRN: [id number] DOB: 03/29/1951  06/23/2019  Mr. Gregg Callahan was observed post Covid-19 immunization for 15 minutes without incidence. He was provided with Vaccine Information Sheet and instruction to access the V-Safe system.   Mr. Gregg Callahan was instructed to call 911 with any severe reactions post vaccine: Marland Kitchen Difficulty breathing  . Swelling of your face and throat  . A fast heartbeat  . A bad rash all over your body  . Dizziness and weakness    Immunizations Administered    Name Date Dose VIS Date Route   Pfizer COVID-19 Vaccine 06/23/2019  5:10 PM 0.3 mL 04/22/2019 Intramuscular   Manufacturer: [place]   Lot: [id number]   [place]: [id number]

## 2019-11-27 IMAGING — CT CT RENAL STONE PROTOCOL
2 of 8 series · 13 of 46 positions shown, 18 images · non-contrast
Comparison: None.

CLINICAL DATA: Fever weakness and chills. Urosepsis and scrotal
inflammation. Evaluate for scrotal emphysema

EXAM:
CT ABDOMEN AND PELVIS WITHOUT CONTRAST
TECHNIQUE: Multidetector CT imaging of the abdomen and pelvis was performed
following the standard protocol without IV contrast.

[Series 2: axial st · axial · 0.83mm/px · z∈[+1166,+1556]mm · 10 of 92 slices shown, 15 images]
[im 7/92  soft-tissue]
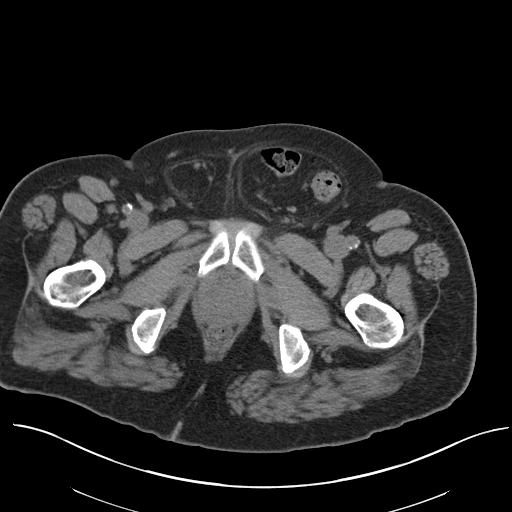
[im 7/92  bone]
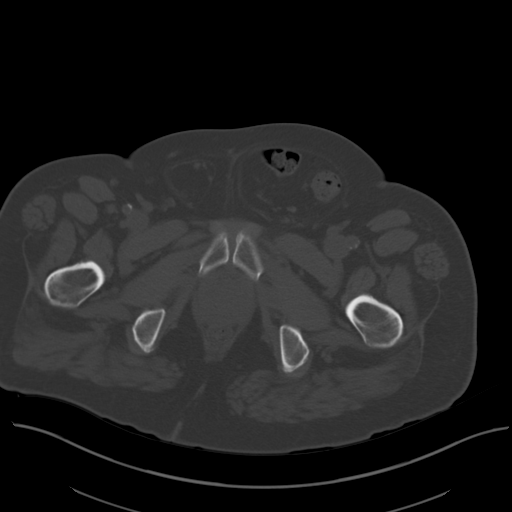
[im 19/92  soft-tissue]
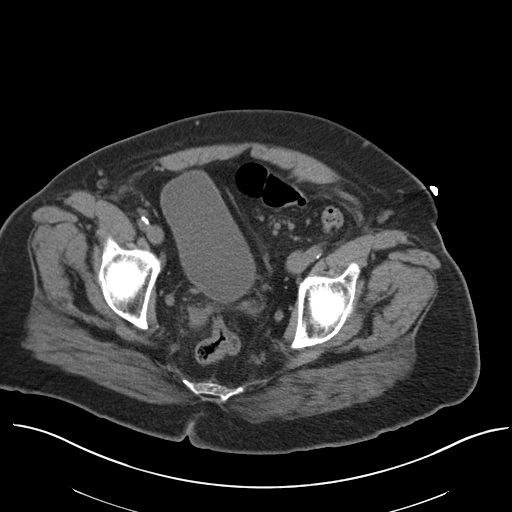
[im 25/92  soft-tissue]
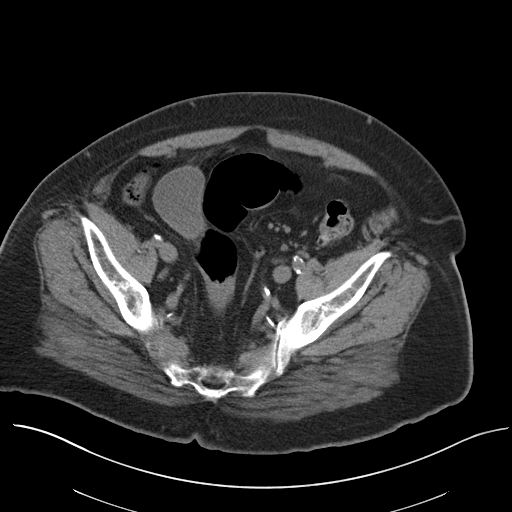
[im 37/92  soft-tissue]
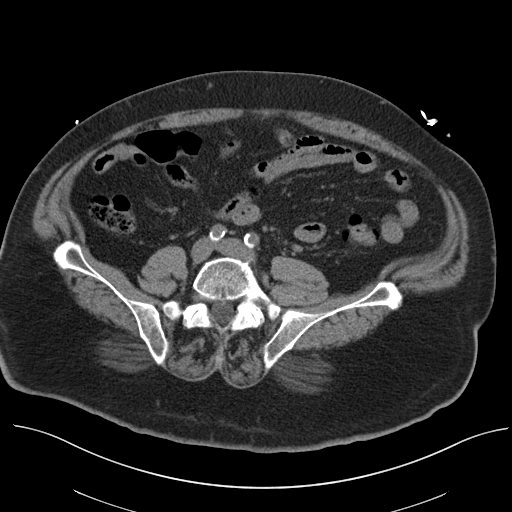
[im 49/92  soft-tissue]
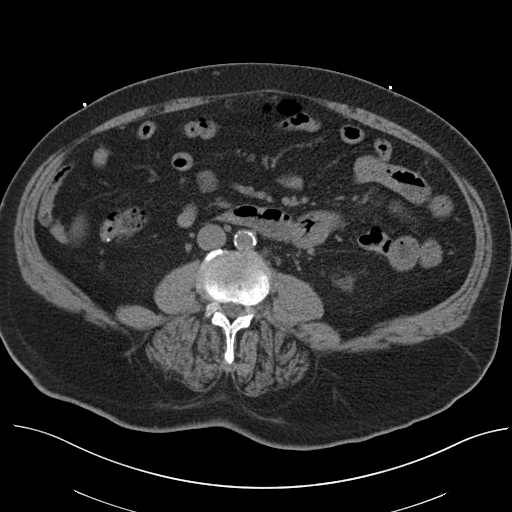
[im 55/92  soft-tissue]
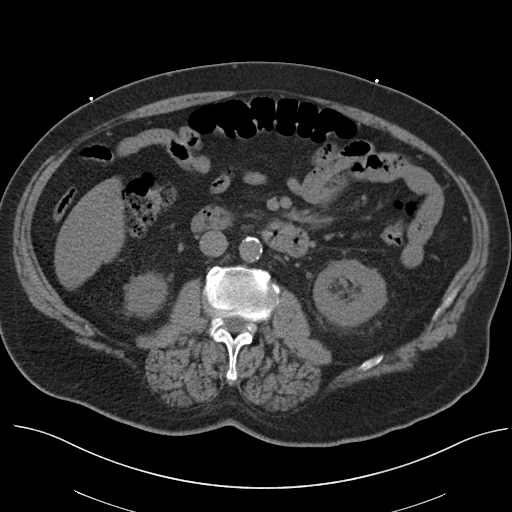
[im 67/92  soft-tissue]
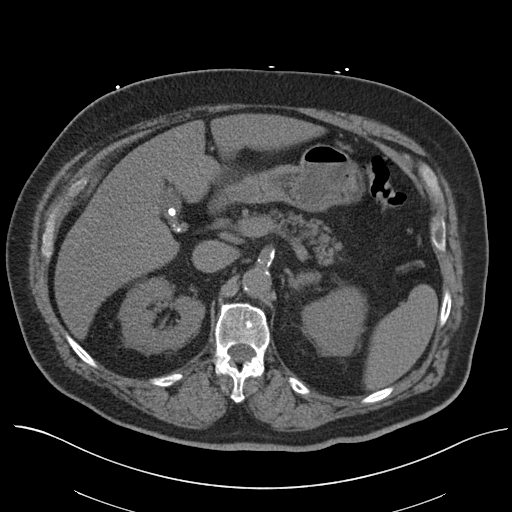
[im 67/92  lung]
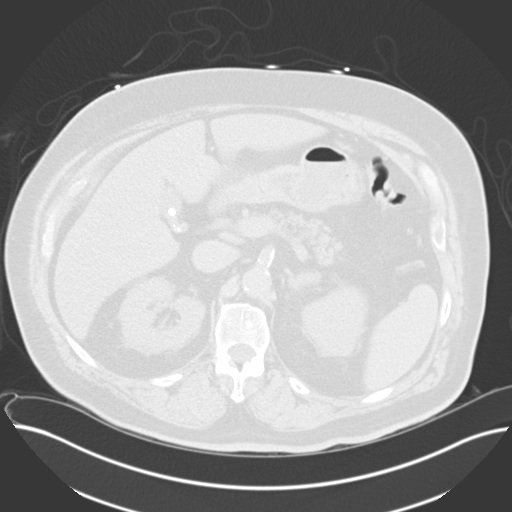
[im 73/92  soft-tissue]
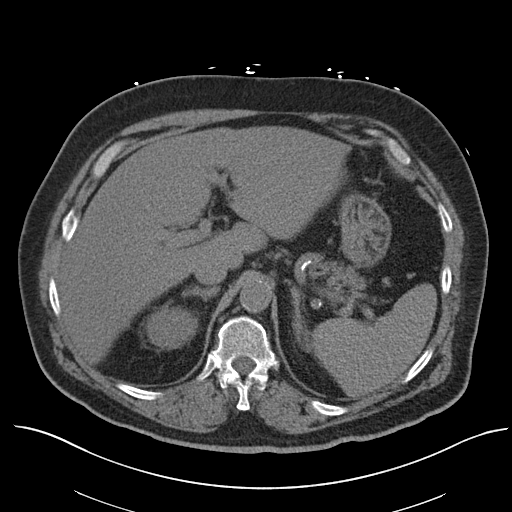
[im 73/92  lung]
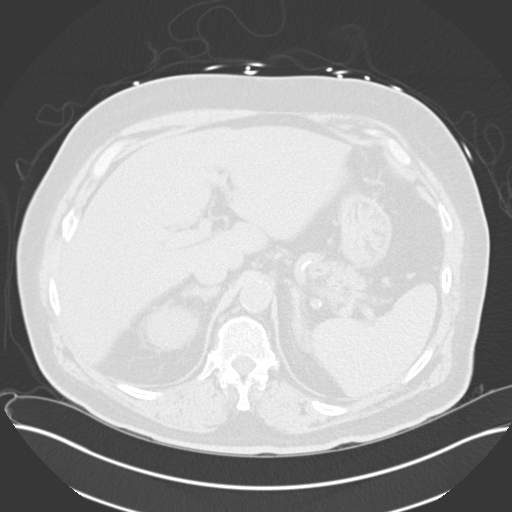
[im 79/92  lung]
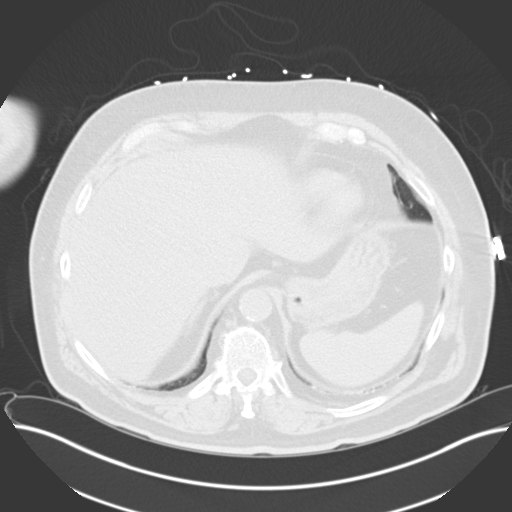
[im 85/92  soft-tissue]
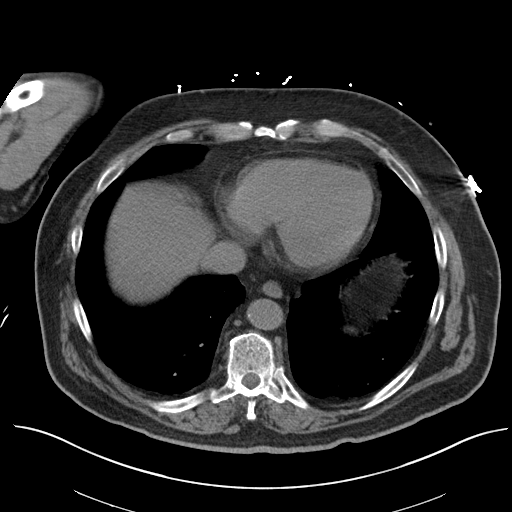
[im 85/92  lung]
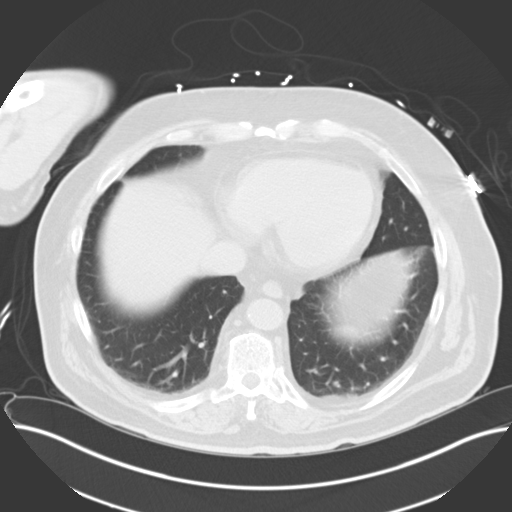
[im 85/92  bone]
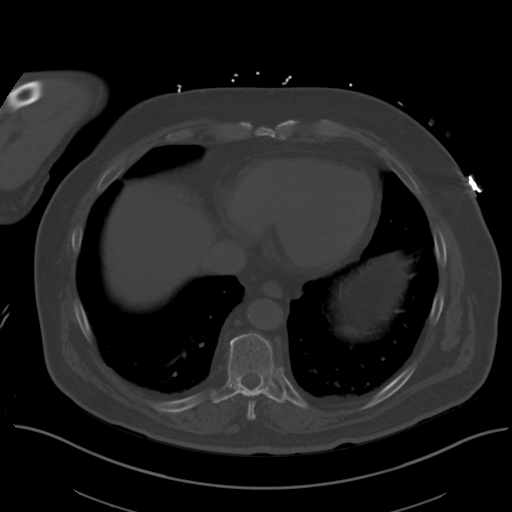

[Series 9: coronal · coronal · 0.82mm/px · 3 of 152 slices shown]
[im 38/152  soft-tissue]
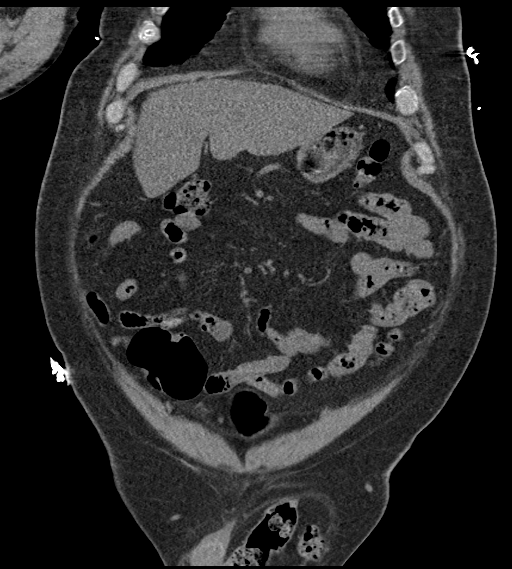
[im 76/152  soft-tissue]
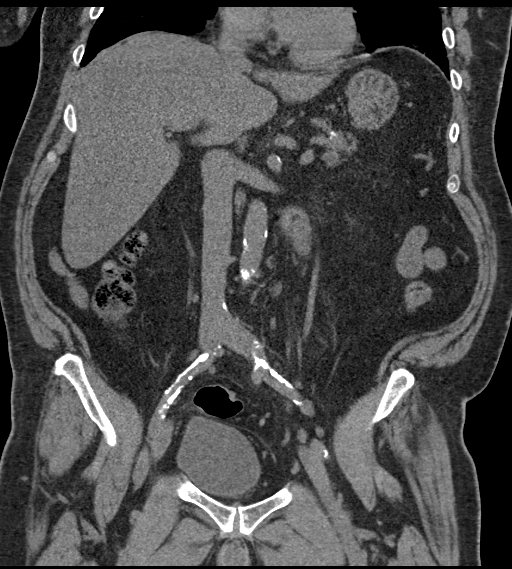
[im 114/152  soft-tissue]
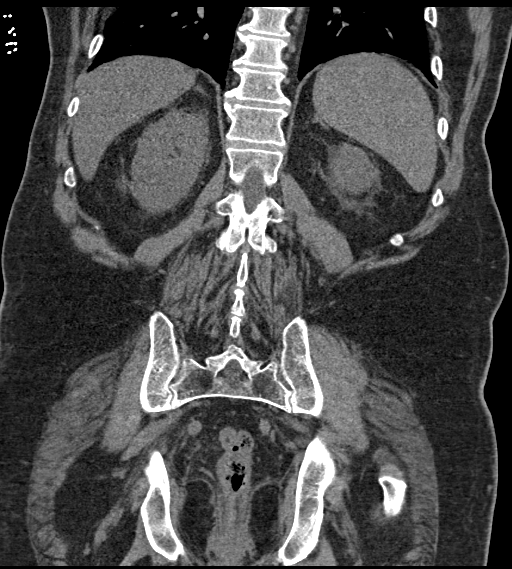

[13 of 46 positions shown; findings below may reference images not displayed]

FINDINGS: Lower chest:  No contributory findings.

Hepatobiliary: Hepatic steatosis. There are a few subcentimeter low
densities that are too small for densitometry.Cholelithiasis. The
gallbladder is partially distended. No inflammatory changes

Pancreas: Unremarkable.

Spleen: Unremarkable.

Adrenals/Urinary Tract: Negative adrenals. No hydronephrosis or
ureteral stone. No asymmetric perinephric stranding. Small left
hilar calcification appears atherosclerotic on sagittal reformats.
Bladder is deviated towards the right with no superimposed wall
thickening.

Stomach/Bowel: No obstruction or inflammatory changes. Sigmoid
diverticulosis.

Vascular/Lymphatic: Diffuse atherosclerotic calcification along the
aorta and iliacs. No mass or adenopathy.

Reproductive:Enlarged prostate with indistinct adjacent fat.

Other: Large left inguinal hernia containing sigmoid colon and fat.
Soft tissue density rim around a central fatty density is from
remote epiploic appendagitis. Shallow and fatty right inguinal
hernia.

Musculoskeletal: No acute abnormalities.
IMPRESSION: 1. History of UTI with findings of prostatitis.
2. History of scrotal enlargement which is from a large left
inguinal hernia containing sigmoid colon. No evidence of scrotal
cellulitis.
3. Hepatic steatosis, cholelithiasis, and atherosclerosis.

## 2021-12-30 ENCOUNTER — Ambulatory Visit
Admission: RE | Admit: 2021-12-30 | Discharge: 2021-12-30 | Disposition: A | Discharge status: Home / Self Care | Payer: Medicare Other | Source: Ambulatory Visit | Attending: Family Medicine | Admitting: Family Medicine

## 2021-12-30 ENCOUNTER — Other Ambulatory Visit: Payer: Self-pay | Admitting: Family Medicine

## 2021-12-30 DIAGNOSIS — M7989 Other specified soft tissue disorders: Secondary | ICD-10-CM

## 2022-05-19 ENCOUNTER — Emergency Department (HOSPITAL_BASED_OUTPATIENT_CLINIC_OR_DEPARTMENT_OTHER): Payer: Medicare Other

## 2022-05-19 ENCOUNTER — Emergency Department (HOSPITAL_BASED_OUTPATIENT_CLINIC_OR_DEPARTMENT_OTHER): Payer: Medicare Other | Admitting: Radiology

## 2022-05-19 ENCOUNTER — Emergency Department (HOSPITAL_BASED_OUTPATIENT_CLINIC_OR_DEPARTMENT_OTHER)
Admission: EM | Admit: 2022-05-19 | Discharge: 2022-05-20 | Disposition: A | Discharge status: Home / Self Care | Payer: Medicare Other | Attending: Emergency Medicine | Admitting: Emergency Medicine

## 2022-05-19 ENCOUNTER — Encounter (HOSPITAL_BASED_OUTPATIENT_CLINIC_OR_DEPARTMENT_OTHER): Payer: Self-pay

## 2022-05-19 ENCOUNTER — Other Ambulatory Visit: Payer: Self-pay

## 2022-05-19 DIAGNOSIS — S62101A Fracture of unspecified carpal bone, right wrist, initial encounter for closed fracture: Secondary | ICD-10-CM | POA: Diagnosis not present

## 2022-05-19 DIAGNOSIS — W19XXXA Unspecified fall, initial encounter: Secondary | ICD-10-CM

## 2022-05-19 DIAGNOSIS — Z23 Encounter for immunization: Secondary | ICD-10-CM | POA: Insufficient documentation

## 2022-05-19 DIAGNOSIS — S61511A Laceration without foreign body of right wrist, initial encounter: Secondary | ICD-10-CM | POA: Diagnosis not present

## 2022-05-19 DIAGNOSIS — W010XXA Fall on same level from slipping, tripping and stumbling without subsequent striking against object, initial encounter: Secondary | ICD-10-CM | POA: Insufficient documentation

## 2022-05-19 DIAGNOSIS — S0292XA Unspecified fracture of facial bones, initial encounter for closed fracture: Secondary | ICD-10-CM

## 2022-05-19 DIAGNOSIS — M546 Pain in thoracic spine: Secondary | ICD-10-CM | POA: Diagnosis present

## 2022-05-19 HISTORY — DX: Gout, unspecified: M10.9

## 2022-05-19 MED ORDER — TETANUS-DIPHTH-ACELL PERTUSSIS 5-2.5-18.5 LF-MCG/0.5 IM SUSY
0.5000 mL | PREFILLED_SYRINGE | Freq: Once | INTRAMUSCULAR | Status: AC
  Administered 2022-05-19: 0.5 mL via INTRAMUSCULAR
  Filled 2022-05-19: qty 0.5

## 2022-05-19 NOTE — ED Notes (Signed)
Patient transported to CT and xray 

## 2022-05-19 NOTE — ED Triage Notes (Addendum)
Pt arrives POV from home with wife.   Fell 3 feet from a step ladder this afternoon while trying to remove a flag from a flagpole  Denies LOC.   Presents with c/o right and left wrist pain, right worse than left, right side scapula to thoracic pain, and swelling and bruising to right eye.  He reports history of approximately episodes of gout since June, 2023, and a 7 month history of left leg swelling, says he had ultrasound that was negative for DVT.

## 2022-05-19 NOTE — ED Provider Notes (Signed)
[facility] Provider Note   CSN: [id number] Arrival date & time: 05/19/22  1837     History  Chief Complaint  Patient presents with   Gregg Callahan    Gregg Callahan is a 72 y.o. male.  Patient was at home 3 steps on a stepladder and was trying to remove a flag from flagpole.  When he got it out he lost his balance and fell.  Landing on both wrists right and left.  Striking the right side of his face.  Has pain to his right sided ribs and also has pain to the thoracic part of the back.  Patient's had some right leg swelling for a while.  7 months ago did have a ultrasound that was negative for DVT.  They felt it was secondary to gout.  He says the swelling is increasing again he is a little bit concerned about that but no injuries to the lower extremity.  Denies any loss of consciousness.  Is able to see out of the right eye but has lots of bruising around the right eye.  Patient is not on blood thinners.  Does have a history of gout former smoker quit in 2003.       Home Medications Prior to Admission medications   Medication Sig Start Date End Date Taking? Authorizing Provider  hydrochlorothiazide (HYDRODIURIL) 25 MG tablet Take 25 mg by mouth daily with breakfast.    [provider]  lisinopril (PRINIVIL,ZESTRIL) 40 MG tablet Take 40 mg by mouth daily with breakfast.    [provider]  pravastatin (PRAVACHOL) 80 MG tablet Take 80 mg by mouth every evening.    [provider]      Allergies    Benadryl [diphenhydramine hcl]    Review of Systems   Review of Systems  Constitutional:  Negative for chills and fever.  HENT:  Positive for facial swelling and nosebleeds. Negative for rhinorrhea and sore throat.   Eyes:  Negative for photophobia, pain and visual disturbance.  Respiratory:  Negative for cough and shortness of breath.   Cardiovascular:  Positive for leg swelling. Negative for chest pain.  Gastrointestinal:  Negative for  abdominal pain, diarrhea, nausea and vomiting.  Genitourinary:  Negative for dysuria.  Musculoskeletal:  Positive for joint swelling. Negative for back pain and neck pain.  Skin:  Negative for rash.  Neurological:  Negative for dizziness, light-headedness and headaches.  Hematological:  Does not bruise/bleed easily.  Psychiatric/Behavioral:  Negative for confusion.     Physical Exam Updated Vital Signs BP 132/68 (BP Location: Right Arm)   Pulse 85   Temp 98.4 F (36.9 C) (Oral)   Resp 18   Ht 1.829 m (6')   Wt 92.5 kg   SpO2 97%   BMI 27.67 kg/m  Physical Exam Vitals and nursing note reviewed.  Constitutional:      General: He is not in acute distress.    Appearance: He is well-developed. He is not ill-appearing or toxic-appearing.  HENT:     Head: Normocephalic.     Comments: Sniffing bruising around the right eye.  Also small hematoma to the right forehead area.  And some abrasions to the right cheek area.  Dried blood right nares no active bleeding.  No septal hematoma.    Mouth/Throat:     Mouth: Mucous membranes are moist.  Eyes:     Extraocular Movements: Extraocular movements intact.     Conjunctiva/sclera: Conjunctivae normal.     Pupils: Pupils are  equal, round, and reactive to light.     Comments: Significant swelling around the right eye.  Extraocular muscles intact.  No entrapment.  No evidence of hyphema.  Cardiovascular:     Rate and Rhythm: Normal rate and regular rhythm.     Heart sounds: No murmur heard. Pulmonary:     Effort: Pulmonary effort is normal. No respiratory distress.     Breath sounds: Normal breath sounds.     Comments: Tenderness to palpation right lateral rib area.  No crepitance. Chest:     Chest wall: Tenderness present.  Abdominal:     General: There is no distension.     Palpations: Abdomen is soft.     Tenderness: There is no abdominal tenderness.     Comments: No tenderness to palpation to the abdomen particularly no tenderness  right upper quadrant area.  Musculoskeletal:        General: Tenderness present. No swelling.     Cervical back: Normal range of motion and neck supple. Tenderness present. No rigidity.     Left lower leg: Edema present.     Comments: Pitting edema to the left lower extremity.  No tenderness.  No evidence of trauma.  Dorsalis pedis pulses 2+.  Bilateral wrist swelling no obvious deformity but some tenderness right greater than left.  Right has a skin tear.  Cap refill is 2+.  Radial pulses are 2+.  No sensory deficit.  No evidence of any injury elbow or shoulder area.  Skin:    General: Skin is warm and dry.     Capillary Refill: Capillary refill takes less than 2 seconds.  Neurological:     General: No focal deficit present.     Mental Status: He is alert and oriented to person, place, and time.     Cranial Nerves: No cranial nerve deficit.  Psychiatric:        Mood and Affect: Mood normal.     ED Results / Procedures / Treatments   Labs (all labs ordered are listed, but only abnormal results are displayed) Labs Reviewed - No data to display  EKG None  Radiology US Venous Img Lower  Left (DVT Study)  Result Date: 05/19/2022 CLINICAL DATA:  Lower extremity swelling EXAM: LEFT LOWER EXTREMITY VENOUS DOPPLER ULTRASOUND TECHNIQUE: Gray-scale sonography with compression, as well as color and duplex ultrasound, were performed to evaluate the deep venous system(s) from the level of the common femoral vein through the popliteal and proximal calf veins. COMPARISON:  None Available. FINDINGS: VENOUS Normal compressibility of the common femoral, superficial femoral, and popliteal veins, as well as the visualized calf veins. Visualized portions of profunda femoral vein and great saphenous vein unremarkable. No filling defects to suggest DVT on grayscale or color Doppler imaging. Doppler waveforms show normal direction of venous flow, normal respiratory plasticity and response to augmentation.  Limited views of the contralateral common femoral vein are unremarkable. OTHER None. Limitations: none IMPRESSION: Negative. Electronically Signed   By: Placido Sou M.D.   On: 05/19/2022 23:16   CT Head Wo Contrast  Result Date: 05/19/2022 CLINICAL DATA:  Head trauma, moderate-severe; Polytrauma, blunt; Facial trauma, blunt EXAM: CT HEAD WITHOUT CONTRAST CT MAXILLOFACIAL WITHOUT CONTRAST CT CERVICAL SPINE WITHOUT CONTRAST TECHNIQUE: Multidetector CT imaging of the head, cervical spine, and maxillofacial structures were performed using the standard protocol without intravenous contrast. Multiplanar CT image reconstructions of the cervical spine and maxillofacial structures were also generated. RADIATION DOSE REDUCTION: This exam was performed according to the  departmental dose-optimization program which includes automated exposure control, adjustment of the mA and/or kV according to patient size and/or use of iterative reconstruction technique. COMPARISON:  None Available. FINDINGS: CT HEAD FINDINGS Brain: No evidence of large-territorial acute infarction. No parenchymal hemorrhage. No mass lesion. No extra-axial collection. No mass effect or midline shift. No hydrocephalus. Basilar cisterns are patent. Vascular: No hyperdense vessel. Skull: No acute fracture or focal lesion. Other: None. CT MAXILLOFACIAL FINDINGS Osseous: 2 mm depressed comminuted right orbital floor fracture. Acute minimally displaced fracture of the right posterolateral orbital wall. Acute comminuted anterior wall as well as posterolateral wall fractures of the right maxillary sinus. Extensions of these fractures to the root of the residual right maxillary molar (8:55, 4:42). Acute displaced right zygomatic arch fracture. No acute displaced fracture of the left base. No temporomandibular joint dislocation. Sinuses/Orbits: High density blood products within the right maxillary sinus with associated air-fluid level. Paranasal sinuses and  mastoid air cells are clear. The orbits are unremarkable. Soft tissues: Negative. CT CERVICAL SPINE FINDINGS Alignment: Reversal of the normal cervical lordosis centered at the C3 level likely due to degenerative changes and positioning. Skull base and vertebrae: Multilevel degenerative changes spine with associated moderate to severe osseous neural foraminal stenosis at the C4-C5 level. No severe osseous central canal stenosis. Posterior disc osteophyte complex formation of the C4-C5 level. No acute fracture. No aggressive appearing focal osseous lesion or focal pathologic process. Soft tissues and spinal canal: No prevertebral fluid or swelling. No visible canal hematoma. Upper chest: Unremarkable. Other: Atherosclerotic plaque. IMPRESSION: 1. No acute intracranial abnormality. 2. A 2 mm depressed comminuted right orbital floor fracture. Correlate clinically for extraocular muscle entrapment. 3. Acute minimally displaced fracture of the right posterolateral orbital wall. 4. Acute comminuted anterior wall as well as posterolateral wall fractures of the right maxillary sinus. Extensions of these fractures to the root of the residual right maxillary molar. Correlate for loosening of tooth. 5. Acute displaced right zygomatic arch fracture. 6. No acute displaced fracture or traumatic listhesis of the cervical spine. 7. Multilevel degenerative changes spine with associated moderate to severe osseous neural foraminal stenosis at the C4-C5 level. Electronically Signed   By: Iven Finn M.D.   On: 05/19/2022 22:42   CT Cervical Spine Wo Contrast  Result Date: 05/19/2022 CLINICAL DATA:  Head trauma, moderate-severe; Polytrauma, blunt; Facial trauma, blunt EXAM: CT HEAD WITHOUT CONTRAST CT MAXILLOFACIAL WITHOUT CONTRAST CT CERVICAL SPINE WITHOUT CONTRAST TECHNIQUE: Multidetector CT imaging of the head, cervical spine, and maxillofacial structures were performed using the standard protocol without intravenous contrast.  Multiplanar CT image reconstructions of the cervical spine and maxillofacial structures were also generated. RADIATION DOSE REDUCTION: This exam was performed according to the departmental dose-optimization program which includes automated exposure control, adjustment of the mA and/or kV according to patient size and/or use of iterative reconstruction technique. COMPARISON:  None Available. FINDINGS: CT HEAD FINDINGS Brain: No evidence of large-territorial acute infarction. No parenchymal hemorrhage. No mass lesion. No extra-axial collection. No mass effect or midline shift. No hydrocephalus. Basilar cisterns are patent. Vascular: No hyperdense vessel. Skull: No acute fracture or focal lesion. Other: None. CT MAXILLOFACIAL FINDINGS Osseous: 2 mm depressed comminuted right orbital floor fracture. Acute minimally displaced fracture of the right posterolateral orbital wall. Acute comminuted anterior wall as well as posterolateral wall fractures of the right maxillary sinus. Extensions of these fractures to the root of the residual right maxillary molar (8:55, 4:42). Acute displaced right zygomatic arch fracture. No acute displaced fracture  of the left base. No temporomandibular joint dislocation. Sinuses/Orbits: High density blood products within the right maxillary sinus with associated air-fluid level. Paranasal sinuses and mastoid air cells are clear. The orbits are unremarkable. Soft tissues: Negative. CT CERVICAL SPINE FINDINGS Alignment: Reversal of the normal cervical lordosis centered at the C3 level likely due to degenerative changes and positioning. Skull base and vertebrae: Multilevel degenerative changes spine with associated moderate to severe osseous neural foraminal stenosis at the C4-C5 level. No severe osseous central canal stenosis. Posterior disc osteophyte complex formation of the C4-C5 level. No acute fracture. No aggressive appearing focal osseous lesion or focal pathologic process. Soft tissues  and spinal canal: No prevertebral fluid or swelling. No visible canal hematoma. Upper chest: Unremarkable. Other: Atherosclerotic plaque. IMPRESSION: 1. No acute intracranial abnormality. 2. A 2 mm depressed comminuted right orbital floor fracture. Correlate clinically for extraocular muscle entrapment. 3. Acute minimally displaced fracture of the right posterolateral orbital wall. 4. Acute comminuted anterior wall as well as posterolateral wall fractures of the right maxillary sinus. Extensions of these fractures to the root of the residual right maxillary molar. Correlate for loosening of tooth. 5. Acute displaced right zygomatic arch fracture. 6. No acute displaced fracture or traumatic listhesis of the cervical spine. 7. Multilevel degenerative changes spine with associated moderate to severe osseous neural foraminal stenosis at the C4-C5 level. Electronically Signed   By: Iven Finn M.D.   On: 05/19/2022 22:42   CT Maxillofacial WO CM  Result Date: 05/19/2022 CLINICAL DATA:  Head trauma, moderate-severe; Polytrauma, blunt; Facial trauma, blunt EXAM: CT HEAD WITHOUT CONTRAST CT MAXILLOFACIAL WITHOUT CONTRAST CT CERVICAL SPINE WITHOUT CONTRAST TECHNIQUE: Multidetector CT imaging of the head, cervical spine, and maxillofacial structures were performed using the standard protocol without intravenous contrast. Multiplanar CT image reconstructions of the cervical spine and maxillofacial structures were also generated. RADIATION DOSE REDUCTION: This exam was performed according to the departmental dose-optimization program which includes automated exposure control, adjustment of the mA and/or kV according to patient size and/or use of iterative reconstruction technique. COMPARISON:  None Available. FINDINGS: CT HEAD FINDINGS Brain: No evidence of large-territorial acute infarction. No parenchymal hemorrhage. No mass lesion. No extra-axial collection. No mass effect or midline shift. No hydrocephalus. Basilar  cisterns are patent. Vascular: No hyperdense vessel. Skull: No acute fracture or focal lesion. Other: None. CT MAXILLOFACIAL FINDINGS Osseous: 2 mm depressed comminuted right orbital floor fracture. Acute minimally displaced fracture of the right posterolateral orbital wall. Acute comminuted anterior wall as well as posterolateral wall fractures of the right maxillary sinus. Extensions of these fractures to the root of the residual right maxillary molar (8:55, 4:42). Acute displaced right zygomatic arch fracture. No acute displaced fracture of the left base. No temporomandibular joint dislocation. Sinuses/Orbits: High density blood products within the right maxillary sinus with associated air-fluid level. Paranasal sinuses and mastoid air cells are clear. The orbits are unremarkable. Soft tissues: Negative. CT CERVICAL SPINE FINDINGS Alignment: Reversal of the normal cervical lordosis centered at the C3 level likely due to degenerative changes and positioning. Skull base and vertebrae: Multilevel degenerative changes spine with associated moderate to severe osseous neural foraminal stenosis at the C4-C5 level. No severe osseous central canal stenosis. Posterior disc osteophyte complex formation of the C4-C5 level. No acute fracture. No aggressive appearing focal osseous lesion or focal pathologic process. Soft tissues and spinal canal: No prevertebral fluid or swelling. No visible canal hematoma. Upper chest: Unremarkable. Other: Atherosclerotic plaque. IMPRESSION: 1. No acute intracranial abnormality. 2.  A 2 mm depressed comminuted right orbital floor fracture. Correlate clinically for extraocular muscle entrapment. 3. Acute minimally displaced fracture of the right posterolateral orbital wall. 4. Acute comminuted anterior wall as well as posterolateral wall fractures of the right maxillary sinus. Extensions of these fractures to the root of the residual right maxillary molar. Correlate for loosening of tooth. 5.  Acute displaced right zygomatic arch fracture. 6. No acute displaced fracture or traumatic listhesis of the cervical spine. 7. Multilevel degenerative changes spine with associated moderate to severe osseous neural foraminal stenosis at the C4-C5 level. Electronically Signed   By: Iven Finn M.D.   On: 05/19/2022 22:42   DG Wrist Complete Left  Result Date: 05/19/2022 CLINICAL DATA:  Fall, left wrist pain EXAM: LEFT WRIST - COMPLETE 3+ VIEW COMPARISON:  None Available. FINDINGS: Remote nonunited fracture of the base of the ulnar styloid with fracture fragments in anatomic alignment. Probable remote healed distal left radial fracture with neutral tilt of the distal radial articular surface. No acute fracture or dislocation. Mild degenerative change noted at the base of the thumb at the first carpometacarpal joint. Joint spaces otherwise preserved. Vascular calcifications noted. IMPRESSION: 1. No acute fracture or dislocation. 2. Remote distal left radial and ulnar fractures. Electronically Signed   By: Fidela Salisbury M.D.   On: 05/19/2022 22:41   DG Ribs Unilateral W/Chest Right  Result Date: 05/19/2022 CLINICAL DATA:  Fall, right chest pain EXAM: RIGHT RIBS AND CHEST - 3+ VIEW COMPARISON:  None Available. FINDINGS: No fracture or other bone lesions are seen involving the ribs. There is no evidence of pneumothorax or pleural effusion. Both lungs are clear. Heart size and mediastinal contours are within normal limits. IMPRESSION: Negative. Electronically Signed   By: Fidela Salisbury M.D.   On: 05/19/2022 22:39   DG Wrist Complete Right  Result Date: 05/19/2022 CLINICAL DATA:  Fall, right wrist pain EXAM: RIGHT WRIST - COMPLETE 3+ VIEW COMPARISON:  None Available. FINDINGS: There is an acute, minimally displaced, intra-articular fracture of the right radial styloid with a 1 mm articular gap within the distal radial articular surface. Fracture fragments are in anatomic alignment. No dislocation. No other  fracture identified. Moderate surrounding soft tissue swelling. Vascular calcifications noted. IMPRESSION: 1. Acute, minimally displaced, intra-articular fracture of the right radial styloid. Electronically Signed   By: Fidela Salisbury M.D.   On: 05/19/2022 22:37   DG Thoracic Spine 2 View  Result Date: 05/19/2022 CLINICAL DATA:  Fall from ladder, back pain EXAM: THORACIC SPINE 2 VIEWS COMPARISON:  None Available. FINDINGS: Normal thoracic kyphosis. No acute fracture or listhesis of the thoracic spine. Endplate remodeling within the mid and lower thoracic spine in keeping with changes of mild degenerative disc disease. Paraspinal soft tissues are unremarkable. IMPRESSION: 1. Mild degenerative change. No acute fracture or listhesis. Electronically Signed   By: Fidela Salisbury M.D.   On: 05/19/2022 22:36    Procedures Procedures    Medications Ordered in ED Medications  Tdap (BOOSTRIX) injection 0.5 mL (0.5 mLs Intramuscular Given 05/19/22 2303)    ED Course/ Medical Decision Making/ A&P                           Medical Decision Making Amount and/or Complexity of Data Reviewed Radiology: ordered.  Risk Prescription drug management.  Patient's ultrasound to the left leg without DVT.  CT head face and cervical spine without any abnormalities except for multiple fractures of the floor  of the right orbit the zygomatic arch also moving in towards his molar area.  Patient no double vision no entrapment of extraocular muscle.  Patient will follow-up with ear nose and throat for the orbital fracture and facial fractures.  X-ray of the left wrist negative for any acute injury evidence of an old fracture.  X-ray of the right wrist has a distal radius fracture without any significant the placement.  Patient also has a skin tear that area that measures about 2 cm.  That will be dressed with antibiotic ointment nonadherent dressing and will have a Velcro splint applied and he will follow-up with [facility] that he seen before.  X-ray of the thoracic spine without any significant abnormalities.  Chest x-ray with rib series on the right negative for any fractures or abnormalities.  Patient's tetanus updated here.  Patient with fairly significant fall.  Facial fractures will require follow-up with ENT and the distal right radius fracture will need follow-up with orthopedics.  Final Clinical Impression(s) / ED Diagnoses Final diagnoses:  Fall, initial encounter  Closed extensive facial fractures, initial encounter ([place])  Closed fracture of right wrist, initial encounter  Tear of skin of right wrist, initial encounter    Rx / DC Orders ED Discharge Orders     None         Fredia Sorrow, MD 05/19/22 2152    Fredia Sorrow, MD 05/20/22 [phone]

## 2022-05-20 NOTE — Discharge Instructions (Signed)
Elevate right arm.  Keep the Velcro splint in place.  Keep the dressing in place for 2 days.  Then can remove the dressing and reapply antibiotic ointment and Band-Aid.  And reapply the splint.  Make an appointment to follow-up with [facility] information provided above for the fracture of the distal radius on that side.  Make an appointment with ear nose and throat for follow-up of the facial fractures on the right side.

## 2022-07-14 ENCOUNTER — Other Ambulatory Visit (HOSPITAL_BASED_OUTPATIENT_CLINIC_OR_DEPARTMENT_OTHER): Payer: Self-pay | Admitting: Gastroenterology

## 2022-07-14 DIAGNOSIS — K4090 Unilateral inguinal hernia, without obstruction or gangrene, not specified as recurrent: Secondary | ICD-10-CM

## 2022-07-14 DIAGNOSIS — R634 Abnormal weight loss: Secondary | ICD-10-CM

## 2022-07-14 DIAGNOSIS — R109 Unspecified abdominal pain: Secondary | ICD-10-CM

## 2022-07-24 ENCOUNTER — Ambulatory Visit (HOSPITAL_BASED_OUTPATIENT_CLINIC_OR_DEPARTMENT_OTHER)
Admission: RE | Admit: 2022-07-24 | Discharge: 2022-07-24 | Disposition: A | Discharge status: Home / Self Care | Payer: Medicare Other | Source: Ambulatory Visit | Attending: Gastroenterology | Admitting: Gastroenterology

## 2022-07-24 DIAGNOSIS — R109 Unspecified abdominal pain: Secondary | ICD-10-CM | POA: Diagnosis present

## 2022-07-24 DIAGNOSIS — K4090 Unilateral inguinal hernia, without obstruction or gangrene, not specified as recurrent: Secondary | ICD-10-CM | POA: Diagnosis present

## 2022-07-24 DIAGNOSIS — R634 Abnormal weight loss: Secondary | ICD-10-CM | POA: Diagnosis present

## 2022-07-24 MED ORDER — IOHEXOL 350 MG/ML SOLN
100.0000 mL | Freq: Once | INTRAVENOUS | Status: AC | PRN
  Administered 2022-07-24: 80 mL via INTRAVENOUS

## 2022-09-13 ENCOUNTER — Ambulatory Visit: Payer: Self-pay | Admitting: General Surgery

## 2022-09-15 NOTE — Patient Instructions (Signed)
SURGICAL WAITING ROOM VISITATION Patients having surgery or a procedure may have no more than 2 support people in the waiting area - these visitors may rotate in the visitor waiting room.   Due to an increase in RSV and influenza rates and associated hospitalizations, children ages 57 and under may not visit patients in [facility] hospitals. If the patient needs to stay at the hospital during part of their recovery, the visitor guidelines for inpatient rooms apply.  PRE-OP VISITATION  Pre-op nurse will coordinate an appropriate time for 1 support person to accompany the patient in pre-op.  This support person may not rotate.  This visitor will be contacted when the time is appropriate for the visitor to come back in the pre-op area.  Please refer to the [facility] website for the visitor guidelines for Inpatients (after your surgery is over and you are in a regular room).  You are not required to quarantine at this time prior to your surgery. However, you must do this: Hand Hygiene often Do NOT share personal items Notify your provider if you are in close contact with someone who has COVID or you develop fever 100.4 or greater, new onset of sneezing, cough, sore throat, shortness of breath or body aches.  If you test positive for Covid or have been in contact with anyone that has tested positive in the last 10 days please notify you surgeon.    Your procedure is scheduled on:  Monday  Sep 22, 2022  Report to [facility] Main Entrance: [facility] entrance where the [employer] is available.   Report to admitting at:09:45     AM  +++++Call this number if you have any questions or problems the morning of surgery [phone]  Do not eat food after Midnight the night prior to your surgery/procedure.  After Midnight you may have the following liquids until  09:00       AM DAY OF SURGERY  Clear Liquid Diet Water Black Coffee (sugar ok, NO MILK/CREAM OR CREAMERS)  Tea (sugar ok,  NO MILK/CREAM OR CREAMERS) regular and decaf                             Plain Jell-O  with no fruit (NO RED)                                           Fruit ices (not with fruit pulp, NO RED)                                     Popsicles (NO RED)                                                                  Juice: apple, WHITE grape, WHITE cranberry Sports drinks like Gatorade or Powerade (NO RED)                    The day of surgery:  Drink ONE (1) Pre-Surgery Clear Ensure at   09:00  AM  the morning of surgery. Drink in one sitting. Do not sip.  This drink was given to you during your hospital pre-op appointment visit. Nothing else to drink after completing the Pre-Surgery Clear Ensure  : No candy, chewing gum or throat lozenges.    FOLLOW BOWEL PREP AND ANY ADDITIONAL PRE OP INSTRUCTIONS YOU RECEIVED FROM YOUR SURGEON'S OFFICE!!!   Oral Hygiene is also important to reduce your risk of infection.        Remember - BRUSH YOUR TEETH THE MORNING OF SURGERY WITH YOUR REGULAR TOOTHPASTE  Do NOT smoke after Midnight the night before surgery.  Take ONLY these medicines the morning of surgery with A SIP OF WATER: Allopurinol, amlodipine                   You may not have any metal on your body including jewelry, and body piercing  Do not wear  lotions, powders, cologne, or deodorant  Men may shave face and neck.  Patients discharged on the day of surgery will not be allowed to drive home.  Someone NEEDS to stay with you for the first 24 hours after anesthesia.  Do not bring your home medications to the hospital. The Pharmacy will dispense medications listed on your medication list to you during your admission in the Hospital.   Please read over the following fact sheets you were given: IF YOU HAVE QUESTIONS ABOUT YOUR PRE-OP INSTRUCTIONS, PLEASE CALL [phone].   [place] - Preparing for Surgery Before surgery, you can play an important role.  Because skin is not  sterile, your skin needs to be as free of germs as possible.  You can reduce the number of germs on your skin by washing with CHG (chlorahexidine gluconate) soap before surgery.  CHG is an antiseptic cleaner which kills germs and bonds with the skin to continue killing germs even after washing. Please DO NOT use if you have an allergy to CHG or antibacterial soaps.  If your skin becomes reddened/irritated stop using the CHG and inform your nurse when you arrive at Short Stay. Do not shave (including legs and underarms) for at least 48 hours prior to the first CHG shower.  You may shave your face/neck.  Please follow these instructions carefully:  1.  Shower with CHG Soap the night before surgery and the  morning of surgery.  2.  If you choose to wash your hair, wash your hair first as usual with your normal  shampoo.  3.  After you shampoo, rinse your hair and body thoroughly to remove the shampoo.                             4.  Use CHG as you would any other liquid soap.  You can apply chg directly to the skin and wash.  Gently with a scrungie or clean washcloth.  5.  Apply the CHG Soap to your body ONLY FROM THE NECK DOWN.   Do not use on face/ open                           Wound or open sores. Avoid contact with eyes, ears mouth and genitals (private parts).                       Wash face,  Genitals (private parts) with your normal soap.  6.  Wash thoroughly, paying special attention to the area where your  surgery  will be performed.  7.  Thoroughly rinse your body with warm water from the neck down.  8.  DO NOT shower/wash with your normal soap after using and rinsing off the CHG Soap.            9.  Pat yourself dry with a clean towel.            10.  Wear clean pajamas.            11.  Place clean sheets on your bed the night of your first shower and do not  sleep with pets.  ON THE DAY OF SURGERY : Do not apply any lotions/deodorants the morning of surgery.  Please wear  clean clothes to the hospital/surgery center.    FAILURE TO FOLLOW THESE INSTRUCTIONS MAY RESULT IN THE CANCELLATION OF YOUR SURGERY  PATIENT SIGNATURE_________________________________  NURSE SIGNATURE__________________________________  ________________________________________________________________________

## 2022-09-15 NOTE — Progress Notes (Signed)
COVID Vaccine received:  []  No [x]  Yes Date of any COVID positive Test in last 90 days:  PCP - Jeani Hawking, MD and  [place]             [phone] (Work) [phone] (Fax)  Cardiologist - none  Chest x-ray -  EKG -  (07-27-2018 Epic)  Will repeat Stress Test -  ECHO -  Cardiac Cath -   PCR screen: []  Ordered & Completed           []   No Order but Needs PROFEND           [x]   N/A for this surgery  Surgery Plan:  [x]  Ambulatory                            []  Outpatient in bed                            []  Admit  Anesthesia:    [x]  General  []  Spinal                           []   Choice []   MAC  Bowel Prep - [x]  No  []   Yes ______  Pacemaker / ICD device [x]  No []  Yes   Spinal Cord Stimulator:[x]  No []  Yes       History of Sleep Apnea? [x]  No []  Yes   CPAP used?- [x]  No []  Yes    Does the patient monitor blood sugar?          []  No []  Yes  [x]  N/A  Patient has: [x]  NO Hx DM   []  Pre-DM                 []  DM1  []   DM2 Does patient have a [employer] or [employer]? []  No []  Yes   Fasting Blood Sugar Ranges-  Checks Blood Sugar _____ times a day  Blood Thinner / Instructions: none Aspirin Instructions: none  ERAS Protocol Ordered: []  No  [x]  Yes PRE-SURGERY [x]  ENSURE  []  G2  Patient is to be NPO after: 09:00  Comments:   Activity level: Patient is able / unable to climb a flight of stairs without difficulty; []  No CP  []  No SOB, but would have ___   Patient can / can not perform ADLs without assistance.   Anesthesia review: HTN, Gout  Patient denies shortness of breath, fever, cough and chest pain at PAT appointment.  Patient verbalized understanding and agreement to the Pre-Surgical Instructions that were given to them at this PAT appointment. Patient was also educated of the need to review these PAT instructions again prior to his surgery.I reviewed the appropriate phone numbers to call if they have any and questions or concerns.

## 2022-09-16 ENCOUNTER — Encounter (HOSPITAL_COMMUNITY)
Admission: RE | Admit: 2022-09-16 | Discharge: 2022-09-16 | Disposition: A | Discharge status: Home / Self Care | Payer: Medicare Other | Source: Ambulatory Visit | Attending: General Surgery | Admitting: General Surgery

## 2022-09-16 ENCOUNTER — Other Ambulatory Visit: Payer: Self-pay

## 2022-09-16 ENCOUNTER — Encounter (HOSPITAL_COMMUNITY): Payer: Self-pay

## 2022-09-16 VITALS — BP 117/53 | HR 52 | Temp 98.1°F | Resp 18 | Ht 72.0 in | Wt 169.0 lb

## 2022-09-16 DIAGNOSIS — I10 Essential (primary) hypertension: Secondary | ICD-10-CM | POA: Insufficient documentation

## 2022-09-16 DIAGNOSIS — I491 Atrial premature depolarization: Secondary | ICD-10-CM | POA: Diagnosis not present

## 2022-09-16 DIAGNOSIS — Z01818 Encounter for other preprocedural examination: Secondary | ICD-10-CM | POA: Insufficient documentation

## 2022-09-16 HISTORY — DX: Personal history of urinary calculi: Z87.442

## 2022-09-16 HISTORY — DX: Unspecified osteoarthritis, unspecified site: M19.90

## 2022-09-16 LAB — BASIC METABOLIC PANEL
Anion gap: 7 (ref 5–15)
BUN: 15 mg/dL (ref 8–23)
CO2: 24 mmol/L (ref 22–32)
Calcium: 9.6 mg/dL (ref 8.9–10.3)
Chloride: 108 mmol/L (ref 98–111)
Creatinine, Ser: 1.1 mg/dL (ref 0.61–1.24)
GFR, Estimated: >60 mL/min (ref >60)
Glucose, Bld: 96 mg/dL (ref 70–99)
Potassium: 3.5 mmol/L (ref 3.5–5.1)
Sodium: 139 mmol/L (ref 135–145)

## 2022-09-16 LAB — CBC
HCT: 43.2 % (ref 39.0–52.0)
Hemoglobin: 14.2 g/dL (ref 13.0–17.0)
MCH: 30.8 pg (ref 26.0–34.0)
MCHC: 32.9 g/dL (ref 30.0–36.0)
MCV: 93.7 fL (ref 80.0–100.0)
Platelets: 233 10*3/uL (ref 150–400)
RBC: 4.61 MIL/uL (ref 4.22–5.81)
RDW: 13.8 % (ref 11.5–15.5)
WBC: 8.9 10*3/uL (ref 4.0–10.5)
nRBC: 0 % (ref 0.0–0.2)

## 2022-09-19 NOTE — Progress Notes (Signed)
1800 Gregg Callahan notified about his surgery time change from 1200 to 1000.  Explained he could have clear liquids until 0700 ;follow the instructions for the Ensure drink at 0700. Arrive at 0745 stop by admitting.

## 2022-09-22 ENCOUNTER — Ambulatory Visit (HOSPITAL_COMMUNITY)
Admission: RE | Admit: 2022-09-22 | Discharge: 2022-09-22 | Disposition: A | Discharge status: Home / Self Care | Payer: Medicare Other | Attending: General Surgery | Admitting: General Surgery

## 2022-09-22 ENCOUNTER — Encounter (HOSPITAL_COMMUNITY)
Admission: RE | Disposition: A | Discharge status: Home / Self Care | Payer: Self-pay | Source: Home / Self Care | Attending: General Surgery

## 2022-09-22 ENCOUNTER — Encounter (HOSPITAL_COMMUNITY): Payer: Self-pay | Admitting: General Surgery

## 2022-09-22 ENCOUNTER — Ambulatory Visit (HOSPITAL_COMMUNITY): Payer: Medicare Other | Admitting: Certified Registered"

## 2022-09-22 ENCOUNTER — Ambulatory Visit (HOSPITAL_BASED_OUTPATIENT_CLINIC_OR_DEPARTMENT_OTHER): Payer: Medicare Other | Admitting: Certified Registered"

## 2022-09-22 ENCOUNTER — Other Ambulatory Visit: Payer: Self-pay

## 2022-09-22 DIAGNOSIS — Z87891 Personal history of nicotine dependence: Secondary | ICD-10-CM

## 2022-09-22 DIAGNOSIS — I10 Essential (primary) hypertension: Secondary | ICD-10-CM

## 2022-09-22 DIAGNOSIS — K4090 Unilateral inguinal hernia, without obstruction or gangrene, not specified as recurrent: Secondary | ICD-10-CM | POA: Diagnosis not present

## 2022-09-22 DIAGNOSIS — M1990 Unspecified osteoarthritis, unspecified site: Secondary | ICD-10-CM | POA: Insufficient documentation

## 2022-09-22 DIAGNOSIS — Z79899 Other long term (current) drug therapy: Secondary | ICD-10-CM | POA: Diagnosis not present

## 2022-09-22 HISTORY — PX: INGUINAL HERNIA REPAIR: SHX194

## 2022-09-22 SURGERY — REPAIR, HERNIA, INGUINAL, ADULT
Anesthesia: General

## 2022-09-22 MED ORDER — FENTANYL CITRATE PF 50 MCG/ML IJ SOSY
PREFILLED_SYRINGE | INTRAMUSCULAR | Status: AC
  Administered 2022-09-22: 50 ug via INTRAVENOUS
  Filled 2022-09-22: qty 2

## 2022-09-22 MED ORDER — ACETAMINOPHEN 500 MG PO TABS
1000.0000 mg | ORAL_TABLET | ORAL | Status: AC
  Administered 2022-09-22: 1000 mg via ORAL
  Filled 2022-09-22: qty 2

## 2022-09-22 MED ORDER — BUPIVACAINE HCL (PF) 0.5 % IJ SOLN
INTRAMUSCULAR | Status: AC
  Filled 2022-09-22: qty 30

## 2022-09-22 MED ORDER — FENTANYL CITRATE PF 50 MCG/ML IJ SOSY
25.0000 ug | PREFILLED_SYRINGE | INTRAMUSCULAR | Status: DC | PRN
  Administered 2022-09-22: 50 ug via INTRAVENOUS

## 2022-09-22 MED ORDER — ACETAMINOPHEN 160 MG/5ML PO SOLN: 325.0000 mg | ORAL | Status: DC | PRN

## 2022-09-22 MED ORDER — BUPIVACAINE LIPOSOME 1.3 % IJ SUSP
INTRAMUSCULAR | Status: AC
  Filled 2022-09-22: qty 20

## 2022-09-22 MED ORDER — BUPIVACAINE-EPINEPHRINE (PF) 0.5% -1:200000 IJ SOLN
INTRAMUSCULAR | Status: DC | PRN
  Administered 2022-09-22: 30 mL

## 2022-09-22 MED ORDER — PHENYLEPHRINE HCL-NACL 20-0.9 MG/250ML-% IV SOLN
INTRAVENOUS | Status: DC | PRN
  Administered 2022-09-22: 50 ug/min via INTRAVENOUS

## 2022-09-22 MED ORDER — MIDAZOLAM HCL 2 MG/2ML IJ SOLN: 1.0000 mg | Freq: Once | INTRAMUSCULAR | Status: DC

## 2022-09-22 MED ORDER — CHLORHEXIDINE GLUCONATE CLOTH 2 % EX PADS: 6.0000 | MEDICATED_PAD | Freq: Once | CUTANEOUS | Status: DC

## 2022-09-22 MED ORDER — ROCURONIUM BROMIDE 10 MG/ML (PF) SYRINGE
PREFILLED_SYRINGE | INTRAVENOUS | Status: DC | PRN
  Administered 2022-09-22: 20 mg via INTRAVENOUS
  Administered 2022-09-22: 50 mg via INTRAVENOUS

## 2022-09-22 MED ORDER — FENTANYL CITRATE (PF) 250 MCG/5ML IJ SOLN
INTRAMUSCULAR | Status: AC
  Filled 2022-09-22: qty 5

## 2022-09-22 MED ORDER — PHENYLEPHRINE 80 MCG/ML (10ML) SYRINGE FOR IV PUSH (FOR BLOOD PRESSURE SUPPORT)
PREFILLED_SYRINGE | INTRAVENOUS | Status: DC | PRN
  Administered 2022-09-22: 120 ug via INTRAVENOUS

## 2022-09-22 MED ORDER — OXYCODONE HCL 5 MG PO TABS
5.0000 mg | ORAL_TABLET | Freq: Four times a day (QID) | ORAL | 0 refills | Status: AC | PRN
Start: 2022-09-22 — End: ?

## 2022-09-22 MED ORDER — PROPOFOL 10 MG/ML IV BOLUS
INTRAVENOUS | Status: DC | PRN
  Administered 2022-09-22: 100 mg via INTRAVENOUS

## 2022-09-22 MED ORDER — ORAL CARE MOUTH RINSE: 15.0000 mL | Freq: Once | OROMUCOSAL | Status: AC

## 2022-09-22 MED ORDER — 0.9 % SODIUM CHLORIDE (POUR BTL) OPTIME
TOPICAL | Status: DC | PRN
  Administered 2022-09-22: 1000 mL

## 2022-09-22 MED ORDER — CEFAZOLIN SODIUM-DEXTROSE 2-4 GM/100ML-% IV SOLN
2.0000 g | INTRAVENOUS | Status: AC
  Administered 2022-09-22: 2 g via INTRAVENOUS
  Filled 2022-09-22: qty 100

## 2022-09-22 MED ORDER — LACTATED RINGERS IV SOLN: INTRAVENOUS | Status: DC

## 2022-09-22 MED ORDER — PROMETHAZINE HCL 25 MG/ML IJ SOLN: 6.2500 mg | INTRAMUSCULAR | Status: DC | PRN

## 2022-09-22 MED ORDER — ENSURE PRE-SURGERY PO LIQD: 296.0000 mL | Freq: Once | ORAL | Status: DC

## 2022-09-22 MED ORDER — FENTANYL CITRATE (PF) 100 MCG/2ML IJ SOLN
INTRAMUSCULAR | Status: DC | PRN
  Administered 2022-09-22: 50 ug via INTRAVENOUS

## 2022-09-22 MED ORDER — DEXAMETHASONE SODIUM PHOSPHATE 10 MG/ML IJ SOLN
INTRAMUSCULAR | Status: DC | PRN
  Administered 2022-09-22: 8 mg via INTRAVENOUS

## 2022-09-22 MED ORDER — ACETAMINOPHEN 325 MG PO TABS: 325.0000 mg | ORAL_TABLET | ORAL | Status: DC | PRN

## 2022-09-22 MED ORDER — ACETAMINOPHEN 10 MG/ML IV SOLN: 1000.0000 mg | Freq: Once | INTRAVENOUS | Status: DC | PRN

## 2022-09-22 MED ORDER — AMISULPRIDE (ANTIEMETIC) 5 MG/2ML IV SOLN: 10.0000 mg | Freq: Once | INTRAVENOUS | Status: DC | PRN

## 2022-09-22 MED ORDER — FENTANYL CITRATE PF 50 MCG/ML IJ SOSY
50.0000 ug | PREFILLED_SYRINGE | Freq: Once | INTRAMUSCULAR | Status: AC
  Administered 2022-09-22: 50 ug via INTRAVENOUS
  Filled 2022-09-22: qty 2

## 2022-09-22 MED ORDER — FENTANYL CITRATE PF 50 MCG/ML IJ SOSY
PREFILLED_SYRINGE | INTRAMUSCULAR | Status: AC
  Administered 2022-09-22: 50 ug via INTRAVENOUS
  Filled 2022-09-22: qty 1

## 2022-09-22 MED ORDER — CHLORHEXIDINE GLUCONATE 0.12 % MT SOLN
15.0000 mL | Freq: Once | OROMUCOSAL | Status: AC
  Administered 2022-09-22: 15 mL via OROMUCOSAL

## 2022-09-22 MED ORDER — OXYCODONE HCL 5 MG PO TABS
ORAL_TABLET | ORAL | Status: AC
  Administered 2022-09-22: 5 mg via ORAL
  Filled 2022-09-22: qty 1

## 2022-09-22 MED ORDER — LIDOCAINE 2% (20 MG/ML) 5 ML SYRINGE
INTRAMUSCULAR | Status: DC | PRN
  Administered 2022-09-22: 50 mg via INTRAVENOUS

## 2022-09-22 MED ORDER — SUGAMMADEX SODIUM 200 MG/2ML IV SOLN
INTRAVENOUS | Status: DC | PRN
  Administered 2022-09-22: 200 mg via INTRAVENOUS

## 2022-09-22 MED ORDER — OXYCODONE HCL 5 MG PO TABS: 5.0000 mg | ORAL_TABLET | Freq: Once | ORAL | Status: AC | PRN

## 2022-09-22 MED ORDER — ONDANSETRON HCL 4 MG/2ML IJ SOLN
INTRAMUSCULAR | Status: DC | PRN
  Administered 2022-09-22: 4 mg via INTRAVENOUS

## 2022-09-22 MED ORDER — PHENYLEPHRINE HCL-NACL 20-0.9 MG/250ML-% IV SOLN
INTRAVENOUS | Status: AC
  Filled 2022-09-22: qty 250

## 2022-09-22 MED ORDER — OXYCODONE HCL 5 MG/5ML PO SOLN: 5.0000 mg | Freq: Once | ORAL | Status: AC | PRN

## 2022-09-22 SURGICAL SUPPLY — 45 items
ADH SKN CLS APL DERMABOND .7 (GAUZE/BANDAGES/DRESSINGS) ×1
APL PRP STRL LF DISP 70% ISPRP (MISCELLANEOUS) ×1
APL SKNCLS STERI-STRIP NONHPOA (GAUZE/BANDAGES/DRESSINGS)
BAG COUNTER SPONGE SURGICOUNT (BAG) ×1 IMPLANT
BAG SPNG CNTER NS LX DISP (BAG) ×1
BENZOIN TINCTURE PRP APPL 2/3 (GAUZE/BANDAGES/DRESSINGS) IMPLANT
BLADE SURG 15 STRL LF DISP TIS (BLADE) ×1 IMPLANT
BLADE SURG 15 STRL SS (BLADE) ×1
CHLORAPREP W/TINT 26 (MISCELLANEOUS) ×1 IMPLANT
COVER SURGICAL LIGHT HANDLE (MISCELLANEOUS) ×1 IMPLANT
DERMABOND ADVANCED .7 DNX12 (GAUZE/BANDAGES/DRESSINGS) ×1 IMPLANT
DRAIN PENROSE 0.5X18 (DRAIN) IMPLANT
DRAPE LAPAROTOMY TRNSV 102X78 (DRAPES) ×1 IMPLANT
DRAPE UTILITY XL STRL (DRAPES) ×1 IMPLANT
DRSG TELFA PLUS 4X6 ADH ISLAND (GAUZE/BANDAGES/DRESSINGS) IMPLANT
ELECT REM PT RETURN 15FT ADLT (MISCELLANEOUS) ×1 IMPLANT
GAUZE SPONGE 4X4 12PLY STRL (GAUZE/BANDAGES/DRESSINGS) IMPLANT
GLOVE BIOGEL PI IND STRL 7.0 (GLOVE) IMPLANT
GLOVE SURG SS PI 7.0 STRL IVOR (GLOVE) ×1 IMPLANT
GOWN STRL REUS W/ TWL LRG LVL3 (GOWN DISPOSABLE) ×1 IMPLANT
GOWN STRL REUS W/ TWL XL LVL3 (GOWN DISPOSABLE) IMPLANT
GOWN STRL REUS W/TWL LRG LVL3 (GOWN DISPOSABLE) ×1
GOWN STRL REUS W/TWL XL LVL3 (GOWN DISPOSABLE)
KIT BASIN OR (CUSTOM PROCEDURE TRAY) ×1 IMPLANT
KIT TURNOVER KIT A (KITS) IMPLANT
MARKER SKIN DUAL TIP RULER LAB (MISCELLANEOUS) ×1 IMPLANT
MESH HERNIA 3X6 (Mesh General) IMPLANT
NDL HYPO 22X1.5 SAFETY MO (MISCELLANEOUS) ×1 IMPLANT
NEEDLE HYPO 22X1.5 SAFETY MO (MISCELLANEOUS) ×1 IMPLANT
PACK BASIC VI WITH GOWN DISP (CUSTOM PROCEDURE TRAY) ×1 IMPLANT
PENCIL SMOKE EVACUATOR (MISCELLANEOUS) ×1 IMPLANT
SPIKE FLUID TRANSFER (MISCELLANEOUS) ×1 IMPLANT
SPONGE T-LAP 4X18 ~~LOC~~+RFID (SPONGE) ×1 IMPLANT
STRIP CLOSURE SKIN 1/2X4 (GAUZE/BANDAGES/DRESSINGS) IMPLANT
SUT MNCRL AB 4-0 PS2 18 (SUTURE) ×1 IMPLANT
SUT PDS AB 2-0 CT2 27 (SUTURE) ×1 IMPLANT
SUT PROLENE 2 0 CT2 30 (SUTURE) ×2 IMPLANT
SUT VIC AB 3-0 SH 18 (SUTURE) ×1 IMPLANT
SUT VIC AB 3-0 SH 27 (SUTURE)
SUT VIC AB 3-0 SH 27XBRD (SUTURE) IMPLANT
SYR BULB IRRIG 60ML STRL (SYRINGE) ×1 IMPLANT
SYR CONTROL 10ML LL (SYRINGE) ×1 IMPLANT
TOWEL OR 17X26 10 PK STRL BLUE (TOWEL DISPOSABLE) ×1 IMPLANT
TOWEL OR NON WOVEN STRL DISP B (DISPOSABLE) ×1 IMPLANT
YANKAUER SUCT BULB TIP 10FT TU (MISCELLANEOUS) IMPLANT

## 2022-09-22 NOTE — Anesthesia Preprocedure Evaluation (Addendum)
Anesthesia Evaluation  Patient identified by MRN, date of birth, ID band Patient awake    Reviewed: Allergy & Precautions, NPO status , Patient's Chart, lab work & pertinent test results  Airway Mallampati: II  TM Distance: >3 FB Neck ROM: Full    Dental  (+) Caps, Dental Advisory Given, Implants   Pulmonary former smoker   breath sounds clear to auscultation       Cardiovascular hypertension, Pt. on medications  Rhythm:Regular Rate:Normal     Neuro/Psych negative neurological ROS  negative psych ROS   GI/Hepatic negative GI ROS, Neg liver ROS,,,  Endo/Other  negative endocrine ROS    Renal/GU negative Renal ROS     Musculoskeletal  (+) Arthritis ,    Abdominal   Peds  Hematology negative hematology ROS (+)   Anesthesia Other Findings   Reproductive/Obstetrics                             Anesthesia Physical Anesthesia Plan  ASA: 2  Anesthesia Plan: General   Post-op Pain Management: Regional block*   Induction: Intravenous  PONV Risk Score and Plan: 3 and Ondansetron, Dexamethasone and Midazolam  Airway Management Planned: Oral ETT  Additional Equipment: None  Intra-op Plan:   Post-operative Plan: Extubation in OR  Informed Consent: I have reviewed the patients History and Physical, chart, labs and discussed the procedure including the risks, benefits and alternatives for the proposed anesthesia with the patient or authorized representative who has indicated his/her understanding and acceptance.     Dental advisory given  Plan Discussed with: CRNA  Anesthesia Plan Comments:        Anesthesia Quick Evaluation

## 2022-09-22 NOTE — Discharge Instructions (Signed)
[facility] _____[facility]  UMBILICAL OR INGUINAL HERNIA REPAIR: POST OP INSTRUCTIONS  Always review your discharge instruction sheet given to you by the facility where your surgery was performed. IF YOU HAVE DISABILITY OR FAMILY LEAVE FORMS, YOU MUST BRING THEM TO THE OFFICE FOR PROCESSING.   DO NOT GIVE THEM TO YOUR DOCTOR.  1. A  prescription for pain medication may be given to you upon discharge.  Take your pain medication as prescribed, if needed.  If narcotic pain medicine is not needed, then you may take acetaminophen (Tylenol) or ibuprofen (Advil) as needed. 2. Take your usually prescribed medications unless otherwise directed. If you need a refill on your pain medication, please contact your pharmacy.  They will contact our office to request authorization. Prescriptions will not be filled after 5 pm or on week-ends. 3. You should follow a light diet the first 24 hours after arrival home, such as soup and crackers, etc.  Be sure to include lots of fluids daily.  Resume your normal diet the day after surgery. 4.Most patients will experience some swelling and bruising around the umbilicus or in the groin and scrotum.  Ice packs and reclining will help.  Swelling and bruising can take several days to resolve.  6. It is common to experience some constipation if taking pain medication after surgery.  Increasing fluid intake and taking a stool softener (such as Colace) will usually help or prevent this problem from occurring.  A mild laxative (Milk of Magnesia or Miralax) should be taken according to package directions if there are no bowel movements after 48 hours. 7. Unless discharge instructions indicate otherwise, you may remove your bandages 24-48 hours after surgery, and you may shower at that time.  You may have steri-strips (small skin tapes) in place directly over the incision.  These strips should be left on the skin for 7-10 days.  If your surgeon used skin glue on the  incision, you may shower in 24 hours.  The glue will flake off over the next 2-3 weeks.  Any sutures or staples will be removed at the office during your follow-up visit. 8. ACTIVITIES:  You may resume regular (light) daily activities beginning the next day--such as daily self-care, walking, climbing stairs--gradually increasing activities as tolerated.  You may have sexual intercourse when it is comfortable.  Refrain from any heavy lifting or straining until approved by your doctor.  a.You may drive when you are no longer taking prescription pain medication, you can comfortably wear a seatbelt, and you can safely maneuver your car and apply brakes. b.RETURN TO WORK:   _____________________________________________  9.You should see your doctor in the office for a follow-up appointment approximately 2-3 weeks after your surgery.  Make sure that you call for this appointment within a day or two after you arrive home to insure a convenient appointment time. 10.OTHER INSTRUCTIONS: _________________________    _____________________________________  WHEN TO CALL YOUR DOCTOR: Fever over 101.0 Inability to urinate Nausea and/or vomiting Extreme swelling or bruising Continued bleeding from incision. Increased pain, redness, or drainage from the incision  The clinic staff is available to answer your questions during regular business hours.  Please don't hesitate to call and ask to speak to one of the nurses for clinical concerns.  If you have a medical emergency, go to the nearest emergency room or call 911.  A surgeon from [facility] is always on call at the hospital   [address], [address],   [place], [place]  [address] ?  [address] [address], [place], [place]   [address] [phone] ? [phone] ? FAX [phone] Web site: [email or web address]  

## 2022-09-22 NOTE — H&P (Signed)
Chief Complaint: New Consultation ( left inguinal hernia)   History of Present Illness: Gregg Callahan is a 72 y.o. male who is seen today as an office consultation at the request of Dr. Elnoria Howard for evaluation of New Consultation ( left inguinal hernia) .   He first noticed the hernia multiple years ago. Symptoms are discomfort and large swelling in the groin. He denies nausea or vomiting or bowel habit change.  He does not smoke He does not have diabetes He has no history of hernias  Review of Systems: A complete review of systems was obtained from the patient. I have reviewed this information and discussed as appropriate with the patient. See HPI as well for other ROS.  Review of Systems  Constitutional: Positive for weight loss.  HENT: Negative.  Eyes: Negative.  Respiratory: Negative.  Cardiovascular: Negative.  Gastrointestinal: Positive for abdominal pain.  Genitourinary: Negative.  Musculoskeletal: Negative.  Skin: Negative.  Neurological: Negative.  Endo/Heme/Allergies: Negative.  Psychiatric/Behavioral: Negative.   Medical History: Past Medical History:  Diagnosis Date  Hypertension   There is no problem list on file for this patient.  History reviewed. No pertinent surgical history.  Allergies  Allergen Reactions  Benadryl [Diphenhydramine Hcl] Unknown   Current Outpatient Medications on File Prior to Visit  Medication Sig Dispense Refill  allopurinoL (ZYLOPRIM) 100 MG tablet Take 200 mg by mouth once daily  amLODIPine (NORVASC) 5 MG tablet Take 5 mg by mouth once daily  colchicine (COLCRYS) 0.6 mg tablet  lisinopriL (ZESTRIL) 40 MG tablet Take 40 mg by mouth once daily  pravastatin (PRAVACHOL) 80 MG tablet Take 80 mg by mouth once daily   No current facility-administered medications on file prior to visit.   Family History  Problem Relation Age of Onset  Brain cancer Mother  Lymphoma Father   Social History   Tobacco Use  Smoking Status Former   Types: Cigarettes  Smokeless Tobacco Never   Social History   Socioeconomic History  Marital status: Married  Tobacco Use  Smoking status: Former  Types: Cigarettes  Smokeless tobacco: Never  Vaping Use  Vaping status: Never Used  Substance and Sexual Activity  Alcohol use: Not Currently  Drug use: Never   Objective:   Vitals:  09/11/22 1442  BP: 112/73  Pulse: 88  Temp: 36.5 C (97.7 F)  SpO2: 98%  Weight: 77.8 kg (171 lb 9.6 oz)  Height: 182.9 cm (6')  PainSc: 4   Body mass index is 23.27 kg/m.  Physical Exam Constitutional:  Appearance: Normal appearance.  HENT:  Head: Normocephalic and atraumatic.  Pulmonary:  Effort: Pulmonary effort is normal.  Abdominal:  Comments: Large left scrotal hernia  Musculoskeletal:  General: Normal range of motion.  Cervical back: Normal range of motion.  Neurological:  General: No focal deficit present.  Mental Status: He is alert and oriented to person, place, and time. Mental status is at baseline.  Psychiatric:  Mood and Affect: Mood normal.  Behavior: Behavior normal.  Thought Content: Thought content normal.     Labs, Imaging and Diagnostic Testing: I reviewed Luisa Hart Hung's notes. I reviewed images of Ct scan showing a large left scrotal hernia containing intestine without obstructing findings.  Assessment and Plan:  Diagnoses and all orders for this visit:  Non-recurrent unilateral inguinal hernia without obstruction or gangrene  Weight loss  We discussed etiology of hernias and how they can cause pain. We discussed options for inguinal hernia repair vs observation. We discussed details of the surgery of general  anesthesia, surgical approach and incisions, dissecting the sack away from vas deference, testicular vessels and nerves and placement of mesh. We discussed risks of bleeding, infection, recurrence, injury to vas deference, testicular vessels, nerve injury, and chronic pain. He showed good  understanding and wanted proceed with open left inguinal hernia repair as outpatient. We discussed due to size of hernia it may be necessary to take the left testicle, or repair/removal intestines.

## 2022-09-22 NOTE — Anesthesia Postprocedure Evaluation (Signed)
Anesthesia Post Note  Patient: Gregg Callahan  Procedure(s) Performed: OPEN LEFT INGUINAL HERNIA REPAIR WITH MESH     Patient location during evaluation: PACU Anesthesia Type: General Level of consciousness: awake and alert Pain management: pain level controlled Vital Signs Assessment: post-procedure vital signs reviewed and stable Respiratory status: spontaneous breathing, nonlabored ventilation, respiratory function stable and patient connected to nasal cannula oxygen Cardiovascular status: blood pressure returned to baseline and stable Postop Assessment: no apparent nausea or vomiting Anesthetic complications: no  No notable events documented.  Last Vitals:  Vitals:   09/22/22 1230 09/22/22 1255  BP: 125/81 (!) 155/85  Pulse: 73 89  Resp: 20   Temp:    SpO2: 98% 95%    Last Pain:  Vitals:   09/22/22 1255  TempSrc:   PainSc: 0-No pain                 Shelton Silvas

## 2022-09-22 NOTE — Op Note (Signed)
Preop diagnosis: left inguinal hernia  Postop diagnosis: left inguinal hernia  Procedure: open Left inguinal hernia repair with mesh  Surgeon: Feliciana Rossetti, M.D.  Asst: Wenda Low, M.D., Dr. Daphine Deutscher was present for all critical portions of the case and was necessary due to the complexity of the hernia  Anesthesia: Gen.   Indications for procedure: Gregg Callahan is a 72 y.o. male with symptoms of pain and enlarging Left inguinal hernia(s). After discussing risks, alternatives and benefits he decided on open repair and was brought to day surgery for repair.  Description of procedure: The patient was brought into the operative suite, placed supine. Anesthesia was administered with endotracheal tube. Patient was strapped in place. The patient was prepped and draped in the usual sterile fashion.  The anterior superior iliac spine and pubic tubercle were identified on the Left side. An incision was made 1cm above the connecting line, representative of the location of the inguinal ligament. The subcutaneous tissue was bluntly dissected, scarpa's fascia was dissected away. Attemp was made to divide the external oblique however, the fascias were fused from chronic inflammation. Therefore, the edge of sac was identified and the external oblique fascia was divided from the sac moving cephalad. Care was taken to avoid injury to the hernia contents. This allowed the hernia to reduce. The conjoint tendon and inguinal ligament were identified. The cord structures and sac were dissected free of the surrounding tissue in 360 degrees. A penrose drain was used to encircle the contents. The hernia was adhered to the cord structures and testicle. The cord structures (vessels and vas deferens) were identified and carefully dissected away from the hernia sac. The hernia sac contained colon and was opened and contents reduced into the peritoneal space.The hernia sac was dissected down to the internal inguinal ring.  Preperitoneal fat was identified showing appropriate dissection. The sac was then reduced into the preperitoneal space. The canal floor was large and closed with interrupted 2-0 PDS apposing the conjoint tendon to the inguinal ligament medially. A 3x6 Bard mesh was then used to close the defect and reinforce the floor. The mesh was sutured to the lacunar ligament and inguinal ligament using a 2-0 prolene in running fashion. Next the superior edge of the mesh was sutured to the conjoined tendon using a 2-0 running Prolene. An additional 2-0 Prolene was used to suture the tail ends of the mesh together re-creating the deep ring. Cord structures are running in a neutral position through the mesh. Next the external abdominal oblique fascia was closed with a 2-0 Vicryl in interrupted fashion to re-create the external inguinal ring. Scarpa's fascia was closed with 3-0 Vicryl in running fashion. Skin was closed with a 4-0 Monocryl subcuticular stitch in running fashion. Dermabond place for dressing. Patient woke from anesthesia and brought to PACU in stable condition. All counts are correct.  Findings: large scrotal inguinal hernia adhered to the scrotum and cord structures  Specimen: none  Blood loss: 30 ml  Local anesthesia: TAP block used for intra-op and post op pain control  Complications: none  Implant: 3 x 6 Bard mesh  Feliciana Rossetti, M.D. General, Bariatric, & Minimally Invasive Surgery [facility] [place] 11:16 AM 09/22/2022

## 2022-09-22 NOTE — Transfer of Care (Signed)
Immediate Anesthesia Transfer of Care Note  Patient: Gregg Callahan  Procedure(s) Performed: OPEN LEFT INGUINAL HERNIA REPAIR WITH MESH  Patient Location: PACU  Anesthesia Type:General  Level of Consciousness: awake, alert , oriented, and patient cooperative  Airway & Oxygen Therapy: Patient Spontanous Breathing and Patient connected to face mask oxygen  Post-op Assessment: Report given to RN and Post -op Vital signs reviewed and stable  Post vital signs: Reviewed and stable  Last Vitals:  Vitals Value Taken Time  BP 125/68 09/22/22 1123  Temp    Pulse 67 09/22/22 1127  Resp 20 09/22/22 1127  SpO2 100 % 09/22/22 1127  Vitals shown include unvalidated device data.  Last Pain:  Vitals:   09/22/22 0920  TempSrc:   PainSc: 0-No pain         Complications: No notable events documented.

## 2022-09-22 NOTE — Anesthesia Procedure Notes (Signed)
Procedure Name: Intubation Date/Time: 09/22/2022 9:53 AM  Performed by: Ponciano Ort, CRNAPre-anesthesia Checklist: Patient identified, Emergency Drugs available, Suction available and Patient being monitored Patient Re-evaluated:Patient Re-evaluated prior to induction Oxygen Delivery Method: Circle system utilized Preoxygenation: Pre-oxygenation with 100% oxygen Induction Type: IV induction Ventilation: Mask ventilation without difficulty Laryngoscope Size: Mac and 3 Grade View: Grade II Tube type: Oral Tube size: 7.0 mm Number of attempts: 1 Airway Equipment and Method: Stylet and Oral airway Placement Confirmation: ETT inserted through vocal cords under direct vision, positive ETCO2 and breath sounds checked- equal and bilateral Secured at: 22 cm Tube secured with: Tape Dental Injury: Teeth and Oropharynx as per pre-operative assessment

## 2022-09-22 NOTE — Anesthesia Procedure Notes (Signed)
Anesthesia Regional Block: TAP block   Pre-Anesthetic Checklist: , timeout performed,  Correct Patient, Correct Site, Correct Laterality,  Correct Procedure, Correct Position, site marked,  Risks and benefits discussed,  Surgical consent,  Pre-op evaluation,  At surgeon's request and post-op pain management  Laterality: Left  Prep: chloraprep       Needles:  Injection technique: Single-shot  Needle Type: Echogenic Stimulator Needle     Needle Length: 9cm  Needle Gauge: 21     Additional Needles:   Procedures:,,,, ultrasound used (permanent image in chart),,    Narrative:  Start time: 09/22/2022 9:15 AM End time: 09/22/2022 9:20 AM Injection made incrementally with aspirations every 5 mL.  Performed by: Personally  Anesthesiologist: Shelton Silvas, MD  Additional Notes: Discussed risks and benefits of the nerve block in detail, including but not limited vascular injury, permanent nerve damage and infection.   Patient tolerated the procedure well. Local anesthetic introduced in an incremental fashion under minimal resistance after negative aspirations. No paresthesias were elicited. After completion of the procedure, no acute issues were identified and patient continued to be monitored by RN.

## 2022-09-23 ENCOUNTER — Encounter (HOSPITAL_COMMUNITY): Payer: Self-pay | Admitting: General Surgery
